# Patient Record
Sex: Female | Born: 1970 | Race: Black or African American | Hispanic: No | Marital: Married | Smoking: Never smoker
Health system: Southern US, Community
[De-identification: ages and names within clinical notes are randomized; demographics above are authoritative.]

## PROBLEM LIST (undated history)

## (undated) DIAGNOSIS — J45909 Unspecified asthma, uncomplicated: Secondary | ICD-10-CM

## (undated) DIAGNOSIS — Z8709 Personal history of other diseases of the respiratory system: Secondary | ICD-10-CM

## (undated) DIAGNOSIS — N3281 Overactive bladder: Secondary | ICD-10-CM

## (undated) HISTORY — DX: Unspecified asthma, uncomplicated: J45.909

## (undated) HISTORY — PX: BREAST EXCISIONAL BIOPSY: SUR124

## (undated) HISTORY — DX: Personal history of other diseases of the respiratory system: Z87.09

## (undated) HISTORY — DX: Overactive bladder: N32.81

---

## 1999-07-25 ENCOUNTER — Encounter: Admission: RE | Admit: 1999-07-25 | Discharge: 1999-10-23 | Payer: Self-pay | Admitting: Obstetrics & Gynecology

## 1999-07-29 ENCOUNTER — Inpatient Hospital Stay (HOSPITAL_COMMUNITY): Admission: AD | Admit: 1999-07-29 | Discharge: 1999-07-29 | Payer: Self-pay | Admitting: Obstetrics and Gynecology

## 1999-08-01 ENCOUNTER — Inpatient Hospital Stay (HOSPITAL_COMMUNITY): Admission: AD | Admit: 1999-08-01 | Discharge: 1999-08-01 | Payer: Self-pay | Admitting: *Deleted

## 1999-08-05 ENCOUNTER — Encounter (HOSPITAL_COMMUNITY): Admission: RE | Admit: 1999-08-05 | Discharge: 1999-09-10 | Payer: Self-pay | Admitting: Obstetrics & Gynecology

## 1999-09-09 ENCOUNTER — Inpatient Hospital Stay (HOSPITAL_COMMUNITY): Admission: AD | Admit: 1999-09-09 | Discharge: 1999-09-11 | Payer: Self-pay | Admitting: Obstetrics and Gynecology

## 1999-09-14 ENCOUNTER — Inpatient Hospital Stay (HOSPITAL_COMMUNITY): Admission: AD | Admit: 1999-09-14 | Discharge: 1999-09-14 | Payer: Self-pay | Admitting: Obstetrics and Gynecology

## 2000-10-25 ENCOUNTER — Ambulatory Visit (HOSPITAL_COMMUNITY): Admission: RE | Admit: 2000-10-25 | Discharge: 2000-10-25 | Payer: Self-pay | Admitting: Internal Medicine

## 2001-02-17 ENCOUNTER — Other Ambulatory Visit: Admission: RE | Admit: 2001-02-17 | Discharge: 2001-02-17 | Payer: Self-pay | Admitting: Obstetrics and Gynecology

## 2001-08-08 ENCOUNTER — Other Ambulatory Visit: Admission: RE | Admit: 2001-08-08 | Discharge: 2001-08-08 | Payer: Self-pay | Admitting: Obstetrics and Gynecology

## 2001-12-07 ENCOUNTER — Other Ambulatory Visit: Admission: RE | Admit: 2001-12-07 | Discharge: 2001-12-07 | Payer: Self-pay | Admitting: Obstetrics and Gynecology

## 2002-04-28 ENCOUNTER — Other Ambulatory Visit: Admission: RE | Admit: 2002-04-28 | Discharge: 2002-04-28 | Payer: Self-pay | Admitting: Obstetrics and Gynecology

## 2002-11-03 ENCOUNTER — Inpatient Hospital Stay (HOSPITAL_COMMUNITY): Admission: AD | Admit: 2002-11-03 | Discharge: 2002-11-05 | Payer: Self-pay | Admitting: Obstetrics and Gynecology

## 2002-12-21 ENCOUNTER — Other Ambulatory Visit: Admission: RE | Admit: 2002-12-21 | Discharge: 2002-12-21 | Payer: Self-pay | Admitting: Obstetrics and Gynecology

## 2003-02-02 ENCOUNTER — Encounter (INDEPENDENT_AMBULATORY_CARE_PROVIDER_SITE_OTHER): Payer: Self-pay | Admitting: *Deleted

## 2003-02-02 ENCOUNTER — Ambulatory Visit (HOSPITAL_COMMUNITY): Admission: RE | Admit: 2003-02-02 | Discharge: 2003-02-02 | Payer: Self-pay

## 2003-07-03 ENCOUNTER — Other Ambulatory Visit: Admission: RE | Admit: 2003-07-03 | Discharge: 2003-07-03 | Payer: Self-pay | Admitting: Obstetrics and Gynecology

## 2004-12-22 ENCOUNTER — Encounter: Admission: RE | Admit: 2004-12-22 | Discharge: 2004-12-22 | Payer: Self-pay | Admitting: Internal Medicine

## 2004-12-28 ENCOUNTER — Encounter: Admission: RE | Admit: 2004-12-28 | Discharge: 2004-12-28 | Payer: Self-pay | Admitting: Internal Medicine

## 2005-02-04 ENCOUNTER — Encounter: Admission: RE | Admit: 2005-02-04 | Discharge: 2005-02-04 | Payer: Self-pay | Admitting: Obstetrics and Gynecology

## 2005-02-05 ENCOUNTER — Ambulatory Visit: Payer: Self-pay | Admitting: Critical Care Medicine

## 2005-05-27 ENCOUNTER — Ambulatory Visit: Payer: Self-pay | Admitting: Pulmonary Disease

## 2005-09-11 ENCOUNTER — Ambulatory Visit: Payer: Self-pay | Admitting: Pulmonary Disease

## 2006-04-29 ENCOUNTER — Encounter: Admission: RE | Admit: 2006-04-29 | Discharge: 2006-04-29 | Payer: Self-pay | Admitting: Obstetrics and Gynecology

## 2006-10-14 ENCOUNTER — Ambulatory Visit: Payer: Self-pay | Admitting: Cardiovascular Disease

## 2006-10-14 ENCOUNTER — Ambulatory Visit (HOSPITAL_COMMUNITY): Admission: RE | Admit: 2006-10-14 | Discharge: 2006-10-14 | Payer: Self-pay | Admitting: Cardiology

## 2006-10-19 ENCOUNTER — Ambulatory Visit: Payer: Self-pay | Admitting: Critical Care Medicine

## 2007-06-14 ENCOUNTER — Encounter: Admission: RE | Admit: 2007-06-14 | Discharge: 2007-06-14 | Payer: Self-pay | Admitting: Obstetrics and Gynecology

## 2007-10-06 DIAGNOSIS — J209 Acute bronchitis, unspecified: Secondary | ICD-10-CM | POA: Insufficient documentation

## 2007-10-06 DIAGNOSIS — J309 Allergic rhinitis, unspecified: Secondary | ICD-10-CM | POA: Insufficient documentation

## 2007-10-06 DIAGNOSIS — J069 Acute upper respiratory infection, unspecified: Secondary | ICD-10-CM | POA: Insufficient documentation

## 2007-10-06 DIAGNOSIS — J4520 Mild intermittent asthma, uncomplicated: Secondary | ICD-10-CM | POA: Insufficient documentation

## 2007-10-07 ENCOUNTER — Ambulatory Visit: Payer: Self-pay | Admitting: Critical Care Medicine

## 2008-10-23 ENCOUNTER — Ambulatory Visit: Payer: Self-pay | Admitting: Critical Care Medicine

## 2008-10-26 ENCOUNTER — Encounter: Payer: Self-pay | Admitting: Critical Care Medicine

## 2009-04-12 ENCOUNTER — Encounter: Payer: Self-pay | Admitting: Critical Care Medicine

## 2009-04-22 ENCOUNTER — Encounter: Payer: Self-pay | Admitting: Critical Care Medicine

## 2009-06-05 ENCOUNTER — Encounter: Admission: RE | Admit: 2009-06-05 | Discharge: 2009-06-05 | Payer: Self-pay | Admitting: Obstetrics and Gynecology

## 2009-11-18 ENCOUNTER — Ambulatory Visit: Payer: Self-pay | Admitting: Critical Care Medicine

## 2010-02-03 ENCOUNTER — Encounter
Admission: RE | Admit: 2010-02-03 | Discharge: 2010-04-10 | Payer: Self-pay | Source: Home / Self Care | Admitting: Internal Medicine

## 2010-02-20 ENCOUNTER — Encounter: Payer: Self-pay | Admitting: Critical Care Medicine

## 2010-08-02 ENCOUNTER — Other Ambulatory Visit: Payer: Self-pay | Admitting: Obstetrics and Gynecology

## 2010-08-02 DIAGNOSIS — Z0000 Encounter for general adult medical examination without abnormal findings: Secondary | ICD-10-CM

## 2010-08-13 ENCOUNTER — Ambulatory Visit
Admission: RE | Admit: 2010-08-13 | Discharge: 2010-08-13 | Disposition: A | Discharge status: Home / Self Care | Payer: BC Managed Care – HMO | Source: Ambulatory Visit | Attending: Obstetrics and Gynecology | Admitting: Obstetrics and Gynecology

## 2010-08-13 DIAGNOSIS — Z0000 Encounter for general adult medical examination without abnormal findings: Secondary | ICD-10-CM

## 2010-08-14 NOTE — Assessment & Plan Note (Signed)
Summary: Pulmonary OV   Primary Provider/Referring Provider:  Dr. Velna Hatchet  CC:  Yearly Follow up.  Pt states breathing doing well overall.  Denies wheezing and chest tightness.  Pt does c/o a congested cough that is occ prod with white mucus x 2 wks.  .  History of Present Illness: This is a 40 year old African female with mild intermittent asthma.   Nov 18, 2009 4:22 PM Since last ov 12months ago The cough developed after a uri two weeks ago, raspy, cough is dry worse in AM and overnite, Now not much dyspnea with exertion, no chest pain.  Notes some pn drip, does not like astepro. Pt denies any significant sore throat, nasal congestion or excess secretions, fever, chills, sweats, unintended weight loss, pleurtic or exertional chest pain, orthopnea PND, or leg swelling Pt denies any increase in rescue therapy over baseline, denies waking up needing it or having any early am or nocturnal exacerbations of coughing/wheezing/or dyspnea. Pt also denies any obvious fluctuation in symptoms wiht weather or environmental change or other alleviating or aggravating factors   Asthma History    Initial Asthma Severity Rating:    Age range: 12+ years    Symptoms: >2 days/week; not daily    Nighttime Awakenings: 3-4/month    Interferes w/ normal activity: minor limitations    SABA use (not for EIB): 0-2 days/week    Exacerbations requiring oral systemic steroids: 0-1/year    Asthma Severity Assessment: Mild Persistent   Preventive Screening-Counseling & Management  Alcohol-Tobacco     Smoking Status: never  Current Medications (verified): 1)  Multivitamins   Tabs (Multiple Vitamin) .... Take 1 Tablet By Mouth Once A Day 2)  Zyrtec Allergy 10 Mg  Tabs (Cetirizine Hcl) .Marland Kitchen.. 1 By Mouth Daily 3)  Caltrate 600+Carroll Plus 600-400 Mg-Unit Tabs (Calcium Carbonate-Vit Carroll-Min) .Marland Kitchen.. 1 By Mouth Two Times A Day 4)  Singulair 10 Mg Tabs (Montelukast Sodium) .... Once Daily 5)  Astepro 0.15 % Soln  (Azelastine Hcl) .... As Needed 6)  Relpax 40 Mg Tabs (Eletriptan Hydrobromide) .... As Needed 7)  Ventolin Hfa 108 (90 Base) Mcg/act  Aers (Albuterol Sulfate) .Marland Kitchen.. 1-2 Puffs Every 4-6 Hours As Needed  Allergies (verified): 1)  ! Penicillin 2)  ! Cephalosporins  Past History:  Past medical, surgical, family and social histories (including risk factors) reviewed, and no changes noted (except as noted below).  Past Medical History: Current Problems:  Hx of ASTHMATIC BRONCHITIS, ACUTE (ICD-466.0) ASTHMA (ICD-493.90) Hx of UPPER RESPIRATORY INFECTION (ICD-465.9) ALLERGIC RHINITIS (ICD-477.9)  Overactive bladder  Family History: Reviewed history from 10/07/2007 and no changes required. noncontrib  Social History: Reviewed history from 10/07/2007 and no changes required. 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  The patient denies shortness of breath with activity, shortness of breath at rest, productive cough, coughing up blood, chest pain, irregular heartbeats, acid heartburn, indigestion, loss of appetite, weight change, abdominal pain, difficulty swallowing, sore throat, tooth/dental problems, headaches, sneezing, itching, ear ache, anxiety, depression, hand/feet swelling, joint stiffness or pain, rash, change in color of mucus, and fever.    Vital Signs:  Patient profile:   40 year old female Height:      60 inches Weight:      200 pounds BMI:     39.20 O2 Sat:      98 % on Room  air Temp:     97.5 degrees F oral Pulse rate:   93 / minute BP sitting:   138 / 78  (left arm) Cuff size:   large  Vitals Entered By: Gweneth Dimitri RN (Nov 18, 2009 4:11 PM)  O2 Flow:  Room air CC: Yearly Follow up.  Pt states breathing doing well overall.  Denies wheezing and chest tightness.  Pt does c/o a congested cough that is occ prod with white mucus x 2 wks.    Comments Medications reviewed with patient Daytime contact number verified with patient. Gweneth Dimitri RN  Nov 18, 2009 4:12 PM    Physical Exam  Additional Exam:  Gen: Pleasant, well nourished, in no distress ENT: nasal erythema, mild postnasal drip Neck: No JVD, no TMG, no carotid bruits Lungs: no use of accessory muscles, no dullness to percussion, exp wheezes Cardiovascular: RRR, heart sounds normal, no murmurs or gallops, no peripheral edema Musculoskeletal: No deformities, no cyanosis or clubbing     Pulmonary Function Test Date: 11/18/2009 4:35 PM Gender: Female  Pre-Spirometry FVC    Value: 1.69 L/min   % Pred: 62.10 % FEV1    Value: 1.35 L     Pred: 2.25 L     % Pred: 60 % FEV1/FVC  Value: 80.04 %     % Pred: 95.90 %  Impression & Recommendations:  Problem # 1:  ASTHMA (ICD-493.90) Assessment Deteriorated  Stable asthma with flare due to mild allergic rhinitis and atopic features, spirometry with peripheral airflow obstruciton plan: cont asterpo/zyrtec cont singulair pulse prednisone start flovent discus 100 one puff two times a day  Medications Added to Medication List This Visit: 1)  Singulair 10 Mg Tabs (Montelukast sodium) .... Once daily 2)  Astepro 0.15 % Soln (Azelastine hcl) .... As needed 3)  Relpax 40 Mg Tabs (Eletriptan hydrobromide) .... As needed 4)  Ventolin Hfa 108 (90 Base) Mcg/act Aers (Albuterol sulfate) .Marland Kitchen.. 1-2 puffs every 4-6 hours as needed 5)  Prednisone 10 Mg Tabs (Prednisone) .... Take as directed take 4 daily for two days, then 3 daily for two days, then two daily for two days then one daily for two days then stop 6)  Flovent Diskus 100 Mcg/blist Aepb (Fluticasone propionate (inhal)) .... One puff two times a day  Complete Medication List: 1)  Multivitamins Tabs (Multiple vitamin) .... Take 1 tablet by mouth once a day 2)  Zyrtec Allergy 10 Mg Tabs (Cetirizine hcl) .Marland Kitchen.. 1 by mouth daily 3)  Caltrate 600+Carroll Plus 600-400 Mg-unit Tabs  (Calcium carbonate-vit Carroll-min) .Marland Kitchen.. 1 by mouth two times a day 4)  Singulair 10 Mg Tabs (Montelukast sodium) .... Once daily 5)  Astepro 0.15 % Soln (Azelastine hcl) .... As needed 6)  Relpax 40 Mg Tabs (Eletriptan hydrobromide) .... As needed 7)  Ventolin Hfa 108 (90 Base) Mcg/act Aers (Albuterol sulfate) .Marland Kitchen.. 1-2 puffs every 4-6 hours as needed 8)  Prednisone 10 Mg Tabs (Prednisone) .... Take as directed take 4 daily for two days, then 3 daily for two days, then two daily for two days then one daily for two days then stop 9)  Flovent Diskus 100 Mcg/blist Aepb (Fluticasone propionate (inhal)) .... One puff two times a day  Other Orders: Est. Patient Level IV (95621) Spirometry w/Graph (30865) Prescription Created Electronically [phone])  Patient Instructions: 1)  Start Flovent Discus  one puff two times a day 2)  Prednisone 10mg  Take 4 daily for two days, then 3 daily for two days,  then two daily for two days then one daily for two days then stop 3)  Stay on singulair, astepro, zyrtec. 4)  Return 3 months for recheck Prescriptions: FLOVENT DISKUS 100 MCG/BLIST AEPB (FLUTICASONE PROPIONATE (INHAL)) one puff two times a day  #1 x 6   Entered and Authorized by:   Storm Frisk MD   Signed by:   Storm Frisk MD on 11/18/2009   Method used:   Electronically to        [facility] (retail)       [address]       [place], [place]  [address]       Ph: [phone]       Fax: [phone]   RxID:   [id number] PREDNISONE 10 MG  TABS (PREDNISONE) Take as directed Take 4 daily for two days, then 3 daily for two days, then two daily for two days then one daily for two days then stop  #20 x 0   Entered and Authorized by:   Storm Frisk MD   Signed by:   Storm Frisk MD on 11/18/2009   Method used:   Electronically to        [facility] (retail)       [address]       [place], [place]  [address]       Ph:  [phone]       Fax: [phone]   RxID:   [id number]    CardioPerfect Spirometry  ID: [id number] Patient: Lauren Carroll DOB: 04-23-1971 Age: 40 Years Old Sex: Female Race: Black Height: 60 Weight: 200 Status: Confirmed Past Medical History:  Current Problems:  Hx of ASTHMATIC BRONCHITIS, ACUTE (ICD-466.0) ASTHMA (ICD-493.90) Hx of UPPER RESPIRATORY INFECTION (ICD-465.9) ALLERGIC RHINITIS (ICD-477.9)   Recorded: 11/18/2009 4:35 PM  Parameter  Measured Predicted %Predicted FVC     1.69        2.71        62.10 FEV1     1.35        2.25        60 FEV1%   80.04        83.50        95.90 PEF    2.13        6.07        35.10   Comments: Combined restriction and obstruction,    Interpretation: Pre: FVC= 1.69L FEV1= 1.35L FEV1%= 80.0% 1.35/1.69 FEV1/FVC (11/18/2009 4:38:39 PM), Moderate restriction     Appended Document: Pulmonary OV fax Dorothyann Peng

## 2010-08-14 NOTE — Medication Information (Signed)
Summary: Flovent / [facility]  Flovent / [facility]   Imported By: Lennie Odor 02/25/2010 14:34:05  _____________________________________________________________________  External Attachment:    Type:   Image     Comment:   External Document

## 2010-08-18 ENCOUNTER — Other Ambulatory Visit: Payer: Self-pay | Admitting: Obstetrics and Gynecology

## 2010-08-18 DIAGNOSIS — R928 Other abnormal and inconclusive findings on diagnostic imaging of breast: Secondary | ICD-10-CM

## 2010-08-20 ENCOUNTER — Telehealth: Payer: Self-pay | Admitting: Critical Care Medicine

## 2010-08-22 ENCOUNTER — Ambulatory Visit
Admission: RE | Admit: 2010-08-22 | Discharge: 2010-08-22 | Disposition: A | Discharge status: Home / Self Care | Payer: BC Managed Care – HMO | Source: Ambulatory Visit | Attending: Obstetrics and Gynecology | Admitting: Obstetrics and Gynecology

## 2010-08-22 DIAGNOSIS — R928 Other abnormal and inconclusive findings on diagnostic imaging of breast: Secondary | ICD-10-CM

## 2010-08-28 NOTE — Progress Notes (Signed)
Summary: prescription for singular-LMTCB x 1  Phone Note Call from Patient   Caller: Patient Call For: Lauren Carroll Summary of Call: Patient phoned stated that she normally gets home delivery for her Singular but she is out and needs a prescription called into [facility] on [address] to last until she gets her home delivery. She stated that we will also be getting a request from [facility] for her home delivery. Patient can be reached at [phone] Initial call taken by: [name],  August 20, 2010 11:07 AM  Follow-up for Phone Call        Due for appt- LMOMTCB  [name]  August 20, 2010 11:09 AM  Pt has scheduled follow up with [facility]. States [facility] will fax request for home delivery. 30 day supply of Singulair sent to [facility]. Zackery Barefoot CMA  August 20, 2010 11:24 AM      Prescriptions: SINGULAIR 10 MG TABS (MONTELUKAST SODIUM) once daily  #30 x 0   Entered by:   Zackery Barefoot CMA   Authorized by:   Storm Frisk MD   Signed by:   Zackery Barefoot CMA on 08/20/2010   Method used:   Electronically to        [facility] (retail)       [address]       [place], [place]  [address]       Ph: [phone]       Fax: [phone]   RxID:   [id number]

## 2010-10-07 ENCOUNTER — Encounter: Payer: Self-pay | Admitting: Critical Care Medicine

## 2010-10-08 ENCOUNTER — Encounter: Payer: Self-pay | Admitting: Critical Care Medicine

## 2010-10-08 ENCOUNTER — Ambulatory Visit (INDEPENDENT_AMBULATORY_CARE_PROVIDER_SITE_OTHER): Payer: BC Managed Care – HMO | Admitting: Critical Care Medicine

## 2010-10-08 VITALS — BP 132/90 | HR 74 | Temp 97.2°F | Ht 61.0 in | Wt 198.8 lb

## 2010-10-08 DIAGNOSIS — J45909 Unspecified asthma, uncomplicated: Secondary | ICD-10-CM

## 2010-10-08 MED ORDER — ALBUTEROL SULFATE HFA 108 (90 BASE) MCG/ACT IN AERS: 2.0000 | INHALATION_SPRAY | RESPIRATORY_TRACT | Status: DC | PRN

## 2010-10-08 NOTE — Patient Instructions (Signed)
No change in medication Focus on weight loss, avoid apple sauce Return 12 months, sooner if needed

## 2010-10-08 NOTE — Assessment & Plan Note (Signed)
Stable asthma,  Weight is an issue.   Plan Diet counselling   Eating too much apple sauce No change in medications

## 2010-10-08 NOTE — Progress Notes (Signed)
Subjective:    Patient ID: Lauren Carroll, female    DOB: 07-16-1970, 40 y.o.   MRN: [id number]  Shortness of Breath This is a chronic problem. The current episode started more than 1 year ago. Episode frequency: exertional only, up 4  flights of stairs, on level ground is dyspneic 1 block,  The problem has been unchanged. Pertinent negatives include no chest pain, fever, headaches, hemoptysis, orthopnea, PND, rhinorrhea, sore throat, sputum production, vomiting or wheezing. The symptoms are aggravated by exercise. Associated symptoms comments: No pn drip, no nasal issues . She has tried steroid inhalers for the symptoms. The treatment provided moderate relief. Her past medical history is significant for allergies and asthma.   This is a 40 y.o.   African female with mild intermittent asthma.   10/08/2010 Since last OV 5/11:  Not much change, now weight issues,  Has to walk long distance to her office and is dyspneic.  Also an issue walking up 4 flights of stairs.  See above for symptoms  Review of Systems  Constitutional: Negative for fever.  HENT: Negative for sore throat and rhinorrhea.   Respiratory: Positive for shortness of breath. Negative for hemoptysis, sputum production and wheezing.   Cardiovascular: Negative for chest pain, orthopnea and PND.  Gastrointestinal: Negative for vomiting.  Neurological: Negative for headaches.   Past Medical History  Diagnosis Date  . Asthmatic bronchitis   . Asthma   . History of URI (upper respiratory infection)   . Allergic rhinitis   . Overactive bladder      History reviewed. No pertinent family history.   History   Social History  . Marital Status: Married    Spouse Name: N/A    Number of Children: N/A  . Years of Education: N/A   Occupational History  . Nurse Instructor RN    Social History Main Topics  . Smoking status: Never Smoker   . Smokeless tobacco: Never Used  . Alcohol Use: No  . Drug Use: No  . Sexually Active:  Not on file   Other Topics Concern  . Not on file   Social History Narrative  . No narrative on file     Allergies  Allergen Reactions  . Cephalosporins   . Penicillins   . Sulfa Drugs Cross Reactors     itching     Outpatient Prescriptions Prior to Visit  Medication Sig Dispense Refill  . Azelastine HCl (ASTEPRO) 0.15 % SOLN As needed       . Calcium Carbonate-Vitamin D (CALTRATE 600+D) 600-400 MG-UNIT per tablet Take 1 tablet by mouth 2 (two) times daily.        . cetirizine (ZYRTEC) 10 MG tablet Take 10 mg by mouth daily.        Marland Kitchen eletriptan (RELPAX) 40 MG tablet One tablet by mouth as needed for migraine headache.  If the headache improves and then returns, dose may be repeated after 2 hours have elapsed since first dose (do not exceed 80 mg per day). may repeat in 2 hours if necessary       . Fluticasone Propionate, Inhal, (FLOVENT DISKUS) 100 MCG/BLIST AEPB One puff two times a day       . montelukast (SINGULAIR) 10 MG tablet Take 10 mg by mouth daily.        . Multiple Vitamin (MULTIVITAMINS PO) Take 1 tablet by mouth once a day       . albuterol (VENTOLIN HFA) 108 (90 BASE) MCG/ACT inhaler 1-2 puffs  every 4-6 hours as needed              Objective:   Physical Exam Gen: Pleasant, obese , in no distress,  normal affect  ENT: No lesions,  mouth clear,  oropharynx clear, no postnasal drip  Neck: No JVD, no TMG, no carotid bruits  Lungs: No use of accessory muscles, no dullness to percussion, clear without rales or rhonchi  Cardiovascular: RRR, heart sounds normal, no murmur or gallops, no peripheral edema  Abdomen: soft and NT, no HSM,  BS normal  Musculoskeletal: No deformities, no cyanosis or clubbing  Neuro: alert, non focal  Skin: Warm, no lesions or rashes         Assessment & Plan:   ASTHMA Stable asthma,  Weight is an issue.   Plan Diet counselling   Eating too much apple sauce No change in medications    Updated Medication  List Outpatient Encounter Prescriptions as of 10/08/2010  Medication Sig Dispense Refill  . albuterol (VENTOLIN HFA) 108 (90 BASE) MCG/ACT inhaler Inhale 2 puffs into the lungs every 4 (four) hours as needed for wheezing.  1 Inhaler  11  . Azelastine HCl (ASTEPRO) 0.15 % SOLN As needed       . Calcium Carbonate-Vitamin D (CALTRATE 600+D) 600-400 MG-UNIT per tablet Take 1 tablet by mouth 2 (two) times daily.        . cetirizine (ZYRTEC) 10 MG tablet Take 10 mg by mouth daily.        . Cholecalciferol (VITAMIN D) 2000 UNITS CAPS Take by mouth daily.        Marland Kitchen eletriptan (RELPAX) 40 MG tablet One tablet by mouth as needed for migraine headache.  If the headache improves and then returns, dose may be repeated after 2 hours have elapsed since first dose (do not exceed 80 mg per day). may repeat in 2 hours if necessary       . Fluticasone Propionate, Inhal, (FLOVENT DISKUS) 100 MCG/BLIST AEPB One puff two times a day       . montelukast (SINGULAIR) 10 MG tablet Take 10 mg by mouth daily.        . Multiple Vitamin (MULTIVITAMINS PO) Take 1 tablet by mouth once a day       . DISCONTD: albuterol (VENTOLIN HFA) 108 (90 BASE) MCG/ACT inhaler 1-2 puffs every 4-6 hours as needed

## 2010-11-28 NOTE — Op Note (Signed)
Lauren Carroll, Lauren Carroll                          ACCOUNT NO.:  [id number]   MEDICAL RECORD NO.:  [id number]                   PATIENT TYPE:  AMB   LOCATION:  SDC                                  FACILITY:  [facility]   PHYSICIAN:  Maxie Better, M.D.            DATE OF BIRTH:  September 17, 1970   DATE OF PROCEDURE:  02/02/2003  DATE OF DISCHARGE:  02/02/2003                                 OPERATIVE REPORT   PREOPERATIVE DIAGNOSIS:  Desires sterilization.   POSTOPERATIVE DIAGNOSES:  1. Desires sterilization.  2. Left paratubal cyst.   PROCEDURE:  1. Laparoscopic tubal ligation with bipolar cautery.  2. Left paratubal cystectomy.   SURGEON:  Maxie Better, M.D.   ANESTHESIA:  General.   INDICATION:  This is a 40 year old gravida 2, para 2 female with a prior  history of an umbilical hernia repair, last menstrual period of February 01, 2003, who now presents for permanent sterilization.  Risks and benefits of  procedure have been explained to the patient.  Alternative birth control  methods have been declined.   PROCEDURE:  Under adequate general anesthesia, the patient was placed in the  dorsal lithotomy position.  Examination under anesthesia revealed a normal-  sized anteverted uterus.  No adnexal masses could be appreciated.  The  patient was thoroughly prepped and draped in usual fashion.  The bladder was  catheterized for a small amount of urine.  A bivalved speculum was placed in  the vagina.  A single-tooth tenaculum was placed on the anterior lip of the  cervix and acorn cannula was introduced into the cervical os and attached to  the tenaculum for manipulation of the uterus.  The bivalved speculum was  removed.  Attention was then turned to the abdomen, where an infraumbilical  scar was seen.  Decision was made to proceed supraumbilically.  Marcaine  0.25% was injected supraumbilically.  A supraumbilical transverse incision  was then made.  The Veress needle was introduced.   Saline was used to test  with good placement noted.  Pressure of 4 was noted.  Three liters of carbon  dioxide were insufflated.  The Veress needle was removed.  A disposable  trocar was introduced into the abdomen without incident.  A lighted video  laparoscope was placed through the port.  Inspection of the pelvis was done.  Normal liver edge was noted, no evidence of adhesions seen.  A suprapubic  incision was then made and a 5-mm port was introduced into the abdomen  without incident.  A probe was then utilized to inspect the pelvis; no  uterus was noted, no evidence of endometriosis was noted, normal right tube  and ovary were noted, left ovary was noted.  The left tube at the distal end  had about a 3-cm cystic mass.  A second site was placed in the left lower  quadrant.  A 5-mm port was placed after a small incision  was made.  The  bipolar cautery was then utilized to cauterize the midportion of the right  fallopian tube for about 1.5 cm.  Through the suprapubic port, the left tube  was stabilized, and particularly the cyst.  Through the left lower quadrant  site, hot scissors were then placed and the serosal surface of the cyst was  then cauterized, at which time incidental rupture of the cyst was noted with  clear yellowish fluid.  Using the alligator scissors, the cyst was dissected  from its surrounding tissue and using traction and countertraction with an  atraumatic grasper, the cyst was then further removed.  The base was then  cut with the hot scissors; good hemostasis was noted.  The abdomen was then  irrigated and suctioned, good hemostasis again noted.  The midportion of the  left fallopian tube was then cauterized using the bipolar cautery for about  a centimeter and a half.  Re-inspection of all sites showed no evidence of  bleeding.  The lower portion was then removed under direct visualization.  The appendix was then seen, which was normal.  The abdomen was deflated.   The supraumbilical port was then removed.  The rectus fascia was then  grasped with Allis clamps supraumbilically and the fascia was then closed  with figure-of-eight 0 Vicryl suture.  The supraumbilical site was then  closed with a subcuticular stitch of 4-0 Vicryl suture and the remaining  lower ports were then closed with Dermabond.  The instruments from the  vagina were then removed.  Specimen was the left cyst wall.  Estimated blood  loss was minimal.  Intraoperative fluid was about 1200.  Urine output was  about 60 mL total.  Complication was none.  The patient tolerated the  procedure well and was transferred to the recovery room in stable condition.                                                Maxie Better, M.D.    [place]/MEDQ  D:  02/02/2003  T:  02/04/2003  Job:  [id number]

## 2010-11-28 NOTE — H&P (Signed)
[facility]  Patient:    Lauren Carroll, Lauren Carroll                       MRN: [id number] Adm. Date:  [id number] Attending:  Shaune Spittle Dictator:   Wynelle Bourgeois, CNM                         History and Physical  BRIEF HISTORY:  Ms. 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  Her pregnancy history is remarkable for uncertain LMP, history of asthma, and mixed connective tissue disorder.  PRENATAL LABORATORY DATA:  Hemoglobin 11.9, platelets 232, blood type B positive, antibody screen negative, sickle cell trait negative, RPR nonreactive, rubella immune, HBSAG negative, ______ elevated at 148, 3 hour GTT with the values of 82, 183, 209 and 127 (elevated at 2 hours).  AFP was declined.  ALLERGIES:  CEPHALOSPORINS and PENICILLIN (rash).  CURRENT MEDICATIONS: 1. Prenatal vitamins. 2. Serevent p.r.n. 3. Flovent p.r.n. 4. Baby aspirin 1 q. day.  PAST MEDICAL HISTORY:  Remarkable for umbilical hernia repair as a child with ______  extraction, asthma, cyst aspirated in July of 2000, and usual childhood diseases.  FAMILY HISTORY:  Her maternal aunt had pregnancy induced hypertension.  Her cousin had a facial deformity.  Maternal grandfather and father had heart disease. Hypertension in patients mother, father, brother, maternal grandparents and paternal grandparents.  Insulin-dependent diabetes mellitus in patients father nd maternal grandparents.  Genetic history significant for patients aunt with Downs syndrome.  SOCIAL HISTORY:  [place] is married to [place] who works in the Education administrator.  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  PHYSICAL EXAMINATION:  HEENT:  Within normal limits.  LUNGS:   Clear to auscultation bilaterally.  HEART:  Heart rate regular rate and rhythm, no murmur.  ABDOMEN:  Gravid at approximately 40 cm.  Uterine contractions occasional.  CERVICAL EXAM:  4 cm, 80% effaced, minus 1 station, fetal heart rate reassuring.  EXTREMITIES:  Within normal limits.  ASSESSMENT: 1. Intrauterine pregnancy at 40 weeks and 4 days. 2. Mixed connective tissue disorder.  PLAN: 1. Admit to Cecilio Asper, M.D. service. 2. Artificial rupture of membranes effected by Dr. Kathryne Sharper this morning for    clear fluid. 3. Intrauterine pressure catheter inserted. 4. Pitocin augmentation p.r.n. DD:  09/09/99 TD:  09/09/99 Job: [id number]

## 2010-11-28 NOTE — H&P (Signed)
Lauren Carroll, Carroll                          ACCOUNT NO.:  [id number]   MEDICAL RECORD NO.:  [id number]                   PATIENT TYPE:  INP   LOCATION:  9162                                 FACILITY:  [facility]   PHYSICIAN:  Maxie Better, M.D.            DATE OF BIRTH:  [birth year]   DATE OF ADMISSION:  11/03/2002  DATE OF DISCHARGE:                                HISTORY & PHYSICAL   CHIEF COMPLAINT:  Induction of labor.   HISTORY OF PRESENT ILLNESS:  This is a 40 year old gravida 2, para 1-0-0-1  married black female.  LMP is February 10, 2002.  EDC of Nov 18, 2002 consistent  with first trimester ultrasound at 13.9 weeks who is now at [redacted] weeks  gestation being admitted for induction of labor secondary to mixed  connective tissue disorder.  The patient is noted to be positive lupus  anticoagulant.  She has been on baby aspirin up until two weeks ago.  Her  prenatal course has been remarkable for asthmatic flare which has been  subsequently managed with a pulmonologist.  The patient has had irregular  contractions, good fetal movements.  On her last examination in the office  on October 26, 2002 she had a cervix that was 3 cm dilated, 70% effaced, -1,  vertex presentation.  The patient is group B Strep negative on October 11, 2002.  She desires permanent sterilization.  Prenatal care was at [facility].  Primary obstetrician Maxie Better, M.D.   PRENATAL LABORATORIES:  Blood type is B+.  Antibody screen negative.  RPR  nonreactive.  Rubella is immune.  Hepatitis B surface antigen is negative.  HIV test was nonreactive.  GC and Chlamydia cultures are negative.  Pap in  October of 2003 showed ASCUS.  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  Repeat Pap  planned postpartum.  Negative GC and Chlamydia cultures.  Normal anatomic  fetal survey on December 22 at 20.2 weeks.  The patient declined AFP 3  testing.   Abnormal one hour glucose challenge test.  Normal three hour  glucose tolerance test.  Ultrasound for SGA baby on October 11, 2002 showed an  estimated fetal weight of 5 pounds which was at the 20th percentile.  Amniotic fluid index of 8.9.  Group B Strep culture is negative.   ALLERGIES:  PENICILLIN, CEPHALOSPORIN.   MEDICATIONS:  1. Prenatal vitamins.  2. Pulmicort.  3. Foradil inhalers.   PAST MEDICAL HISTORY:  1. Asthma.  2. Migraines.  3. 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   OBSTETRICAL HISTORY:  February of 2001 forceps delivery live female 6 pounds  8 ounces 37 weeks.  Postpartum hemorrhage.  No transfusions.   FAMILY HISTORY:  Noncontributory.   SOCIAL HISTORY:  Married.  Nonsmoker.  One child.  Works as a Runner, broadcasting/film/video.   REVIEW OF SYSTEMS:  Negative except as noted in history of present illness.   PHYSICAL EXAMINATION:  GENERAL:  Well-developed, well-nourished, gravid  black female in no acute distress.  VITAL SIGNS:  Blood pressure 123/59, afebrile.  SKIN:  No lesions.  HEENT:  Anicteric sclerae.  Pink conjunctivae.  Oropharynx negative.  HEART:  Regular rate and rhythm without murmur.  LUNGS:  Clear to auscultation.  BREASTS:  Soft.  Nontender.  No palpable mass.  NECK:  Supple.  No palpable axillary, supraclavicular nodes.  ABDOMEN:  Gravid.  Term fundal height.  PELVIC:  3, 80%, -1, vertex.  EXTREMITIES:  Trace edema.   LABORATORIES:  Tracing:  Fetal heart rate baseline of 160, irregular  contractions.   IMPRESSION:  1. Mixed connective tissue disorder for induction.  2. Asthmatic.  3. Intrauterine gestation at 38 weeks.  4. History of postpartum hemorrhage.  5. Desires permanent sterilization.   PLAN:  Admission.  Routine admission laboratories.  Artificial rupture of  membranes.  Postpartum tubal ligation.  Low dose Pitocin  Risks of procedure  with respect to tubal ligation have been  reviewed.  Alternative birth  control methods were declined.  The patient understands it is unreversible,  failure rate of 1 out of 300.  Post tubal ligation syndrome was discussed.  Injury to surrounding organs structures was also discussed.                                               Maxie Better, M.D.    /MEDQ  D:  11/03/2002  T:  11/03/2002  Job:  [id number]

## 2010-11-28 NOTE — Assessment & Plan Note (Signed)
[facility]                             PULMONARY OFFICE NOTE   Lauren Carroll, Lauren Carroll                     MRN:          [id number]  DATE:10/21/2006                            DOB:          [birth year]    Lauren Carroll returns in followup, has not been seen in this office since  March 2007, and I have not seen the patient since October 2005.  This is  a 40 year old African-American female, history of moderate persistent  asthma.  She had been controlled on Singulair 10 mg daily and albuterol  p.r.n.  She did not require systemic steroids or inhaled  corticosteroids.  She is here for a Singulair refill.  She has not had  any active complaints, active wheezing, cough, or shortness of breath.   PAST MEDICAL HISTORY:  No change in the past medical history from  previous exams.   CURRENT MEDICATIONS:  1. Multivitamin daily.  2. Singulair 10 mg daily.  3. Allegra D 60 mg, is now off.   PHYSICAL EXAMINATION:  GENERAL:  This is a middle aged African-American  female in no distress.  VITAL SIGNS:  Temp 98, blood pressure 130/90, pulse 101, saturation was  98% on room air.  CHEST:  Completely clear to auscultation and percussion.  There was no  evidence of wheeze, rale or rhonchi.  CARDIAC:  Regular rate and rhythm without S3, normal S1, S2.  ABDOMEN:  Soft, nontender.  EXTREMITIES:  No edema or clubbing, no venous disease.  SKIN:  Clear.  NEUROLOGIC:  Intact.  HEENT:  No jugular venous distention, no lymphadenopathy, oropharynx  clear.  NECK:  Supple.   IMPRESSION:  Stable air flow function with moderate persistent asthma.   PLAN:  Maintain inhaled medicines as currently dosed, maintain Singulair  10 mg as currently dosed.  We will see this patient back return followup  in 6 months.     Charlcie Cradle Delford Field, MD, [facility]  Electronically Signed    PEW/MedQ  DD: 10/21/2006  DT: 10/21/2006  Job #: [id number]

## 2010-11-28 NOTE — Op Note (Signed)
[facility]  Patient:    Lauren Carroll, Lauren Carroll                       MRN: [id number] Proc. Date: 09/09/99 Adm. Date:  [id number] Attending:  Dierdre Forth Pearline                           Operative Report  PREOPERATIVE DIAGNOSIS:       Fetal tachycardia, maternal temperature.  OBSTETRICIAN:                 Cecilio Asper, M.D.  OPERATION:                    Low forceps vaginal delivery.  ANESTHESIA:                   Epidural.  FINDINGS:                     Viable female infant, named Gretchen Portela. Apgars 8 and 9, weight 6 pounds 6 ounces.  Placenta appears intact with three-vessel cord. Episiotomy is none.  There was a fourth-degree laceration.  ESTIMATED BLOOD LOSS:         400 cc.  INDICATIONS:                  The patient is a 40 year old, gravida 1, para 0, ho presents at term for induction of labor secondary to a history of mixed connective tissue disorder.  The patient slowly progressed to 10 cm of dilatation with Pitocin augmentation.  She began her second stage without complication and pushed for approximately two hours.  The patients maximum temperature was 101.5.  Fetal heart rate was in the 190s to 200.  There was good variability but the patient was making very slow progress.  The pelvis is felt to be adequate, maternal pushing effort was maximum.  Contractions were regular and strong on 1 mU/min of Pitocin.  The vertex was examined and found to be in a direct occiput posterior position.  At this point the patient was consent for an operative vaginal delivery in the form of forceps.  DESCRIPTION OF PROCEDURE:     The patient was placed in the stirrups and a red rubber catheter was used to drain the bladder of urine.  Pelvic examination again confirmed occiput posterior position.  The Luikart-Simpson forceps were placed without difficulty.  The sagittal suture was noted to be in the middle of the forceps blade.   With the next contraction, my right hand on the handles and my eft hand on the forceps shank, moderate traction was applied and the patient pushed the vertex to a +3 station.  With the next contraction the infant was delivered. She was suctioned on the operative field.  There was a nuchal cord x 1.  Anterior posterior shoulders were then delivered and the infant was carried to the warmer where she was cared for by the respiratory team.  Cord bloods were obtained. Placenta delivered without difficulty.  The uterus firmed up nicely with Pitocin. Inspection of the vagina and perineum revealed no sulcus lacerations but there as a fourth-degree midline laceration.  This was repaired with 1% lidocaine for anesthesia and #3-0 Vicryl without difficulty in normal fashion.  The patient tolerated the procedure well.  At the time of this dictation, mom is recovering  without difficulty and the infant is in the  newborn nursery under observation. DD:  09/09/99 TD:  09/10/99 Job: [id number]

## 2010-11-28 NOTE — H&P (Signed)
[facility]  Patient:    Lauren Carroll, Lauren Carroll                       MRN: [id number] Adm. Date:  [id number] Disc. Date: [id number] Attending:  Shaune Spittle Dictator:   Maggie Schwalbe, C.N.M.                         History and Physical  DATE OF BIRTH:                [birth year].  HISTORY OF PRESENT ILLNESS:   This is a 40 year old gravida 1, para 1, who is five days postpartum who experienced a late postpartum hemorrhage today at home. 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  She noticed that she saturated a bath towel and blood was running down her legs.  She has had an uncomplicated postpartum course until today.  She has a history of a connective tissue disorder with respiratory involvement and fibromyalgia.  She has been taking Atrovent p.r.n. nd Flovent p.r.n.  Currently she has not had any respiratory distress.  She was diagnosed with positive lupus antibodies during this pregnancy and received one  baby aspirin daily for her pregnancy.  Since delivery she has been on ibuprofen  p.r.n., has not experienced any fevers or abdominal pain other than this severe  menstrual-like cramping that preceded her hemorrhaging.  She is currently bottle feeding.  She has had no change to her physical activity and has been resting at home.  LABORATORY:                   Hemoglobin 11.2, hematocrit 34.6, platelets 259, white count 8.4.  Her CBC following delivery on March 1:  Hemoglobin 10.7, hematocrit 31.5, platelets 202, white count 12.7.  MEDICAL HISTORY:              History of asthma, connective tissue disorder, and fibromyalgia, use of inhaler p.r.n.  She was diagnosed in 2000 with connective tissue disease by Dr. Chase Picket and has been following up.  She was initially  placed on prednisone treatment and this was discontinued for her pregnancy. The plan was to follow up six months after delivery, because she appeared to be  in stable condition.  She experienced a wisdom tooth extraction and umbilical hernia repair without complication.  Forceps delivery on February 27 of a viable baby girl, weighing 6 pounds 6 ounces, Apgars 8 and 9, with a midline episiotomy with no extensions.  This was under epidural anesthesia.  This labor was significant for Pitocin augmentation.  FAMILY HISTORY:               Significant for mother, maternal grandfather, and  father with heart disease; mother, father, and brother with high blood pressure; father with non-insulin dependent diabetes mellitus as well as maternal grandparents.  SOCIAL HISTORY:               Married to [employer], African-American.  He is present and supportive.  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  Father of the baby is a Restaurant manager, fast food, Engineer, maintenance (IT), full time, stable monogamous relationship.  Denies smoking, alcohol, or drug abuse.  PHYSICAL EXAMINATION:  VITAL SIGNS:                  Temperature 98.1, pulse 92, respirations 26, blood pressure 131/71.  HEENT:                        Within normal limits.  LUNGS:                        Bilaterally clear.  HEART:                        Regular rate and rhythm.  ABDOMEN:                      Soft and nontender, other than menstrual like cramps. Uterus is approximately 12 weeks size, well and diluted and firm.  PELVIC:                       Cervix is slightly open.  Large amount of clots were evacuated from the vaginal vault.  Approximately 400 cc blood loss over a period of 3 minutes.  EXTREMITIES:                  Trace edema.  DTRs +1.  ASSESSMENT:                   Delayed postpartum hemorrhage, five days postpartum. Methergine administered with relief.  IV with Pitocin 1000 cc bolused with good  results.  Bleeding has decreased.  Patient desires evaluation over a 23 hour observational period.  She is awaiting  discussions with Dr. Stefano Gaul regarding er status and prognosis. DD:  09/14/99 TD:  09/14/99 Job: [id number]

## 2011-01-26 ENCOUNTER — Other Ambulatory Visit: Payer: Self-pay | Admitting: Obstetrics and Gynecology

## 2011-01-26 DIAGNOSIS — N6321 Unspecified lump in the left breast, upper outer quadrant: Secondary | ICD-10-CM

## 2011-01-30 ENCOUNTER — Ambulatory Visit
Admission: RE | Admit: 2011-01-30 | Discharge: 2011-01-30 | Disposition: A | Discharge status: Home / Self Care | Payer: BC Managed Care – HMO | Source: Ambulatory Visit | Attending: Obstetrics and Gynecology | Admitting: Obstetrics and Gynecology

## 2011-01-30 DIAGNOSIS — N6321 Unspecified lump in the left breast, upper outer quadrant: Secondary | ICD-10-CM

## 2011-03-19 ENCOUNTER — Other Ambulatory Visit: Payer: Self-pay | Admitting: Critical Care Medicine

## 2011-05-03 ENCOUNTER — Other Ambulatory Visit: Payer: Self-pay | Admitting: Critical Care Medicine

## 2011-05-27 ENCOUNTER — Other Ambulatory Visit: Payer: Self-pay | Admitting: Internal Medicine

## 2011-06-02 ENCOUNTER — Other Ambulatory Visit: Payer: Self-pay | Admitting: *Deleted

## 2011-06-03 ENCOUNTER — Ambulatory Visit (INDEPENDENT_AMBULATORY_CARE_PROVIDER_SITE_OTHER): Payer: BC Managed Care – HMO

## 2011-06-03 DIAGNOSIS — Z23 Encounter for immunization: Secondary | ICD-10-CM

## 2011-06-12 ENCOUNTER — Other Ambulatory Visit (HOSPITAL_COMMUNITY): Payer: BC Managed Care – HMO

## 2011-06-19 ENCOUNTER — Other Ambulatory Visit: Payer: BC Managed Care – HMO

## 2011-06-29 ENCOUNTER — Ambulatory Visit (HOSPITAL_COMMUNITY)
Admission: RE | Admit: 2011-06-29 | Discharge: 2011-06-29 | Disposition: A | Discharge status: Home / Self Care | Payer: BC Managed Care – HMO | Source: Ambulatory Visit | Attending: Internal Medicine | Admitting: Internal Medicine

## 2011-06-29 DIAGNOSIS — R1310 Dysphagia, unspecified: Secondary | ICD-10-CM | POA: Insufficient documentation

## 2011-06-30 ENCOUNTER — Other Ambulatory Visit: Payer: BC Managed Care – HMO

## 2011-07-23 ENCOUNTER — Other Ambulatory Visit: Payer: Self-pay | Admitting: Obstetrics and Gynecology

## 2011-07-23 DIAGNOSIS — N63 Unspecified lump in unspecified breast: Secondary | ICD-10-CM

## 2011-07-30 ENCOUNTER — Telehealth: Payer: Self-pay | Admitting: Critical Care Medicine

## 2011-07-30 NOTE — Telephone Encounter (Signed)
Pt instructed to call insurance company to see what drug is preferred in place of Flovent and then let us know.  Pt to call back with this info.

## 2011-07-31 NOTE — Telephone Encounter (Signed)
Pt says per her insurance formulary alternatives to Flovent Diskus are:  Asmanex, Qvar and Pulmicort Flexhaler. All 3 are covered at a lower cost and need no PA. She is aware we will not give her a callback until next week. PW is not in the office. Please advise. Allergies  Allergen Reactions  . Cephalosporins   . Penicillins   . Sulfa Drugs Cross Reactors     itching

## 2011-08-01 NOTE — Telephone Encounter (Signed)
Change to asmanex two puff daily 

## 2011-08-03 NOTE — Telephone Encounter (Signed)
Patient made aware of inhaler change from Flovent to Asmanex. She will be taking 2 puffs daily. I have left 2 samples for the patient to come by and pick up. She will also ask for one of the nurses to demonstrate how to use this inhaler as well. Patient will call before running out of the sample to report how she is doing and then request RX be called to her pharmacy at that time. Medication list has been updated to reflect the change.

## 2011-08-13 ENCOUNTER — Telehealth: Payer: Self-pay | Admitting: Critical Care Medicine

## 2011-08-13 MED ORDER — MOMETASONE FUROATE 220 MCG/INH IN AEPB: 2.0000 | INHALATION_SPRAY | Freq: Every day | RESPIRATORY_TRACT | Status: DC

## 2011-08-13 NOTE — Telephone Encounter (Signed)
Spoke with pt. She states that asmanex works very well. Needs rx called in b/c her sample runs out. Rx was sent to pharm. Nothing further needed.

## 2011-08-25 ENCOUNTER — Ambulatory Visit
Admission: RE | Admit: 2011-08-25 | Discharge: 2011-08-25 | Disposition: A | Discharge status: Home / Self Care | Payer: BC Managed Care – PPO | Source: Ambulatory Visit | Attending: Obstetrics and Gynecology | Admitting: Obstetrics and Gynecology

## 2011-08-25 DIAGNOSIS — N63 Unspecified lump in unspecified breast: Secondary | ICD-10-CM

## 2011-10-18 ENCOUNTER — Other Ambulatory Visit: Payer: Self-pay | Admitting: Critical Care Medicine

## 2011-10-19 NOTE — Telephone Encounter (Signed)
Pt was last seen by Dr. Delford Field on 10/08/10.  She does have a pending appt with him on 10/28/11.  I sent rx to pharm with comment pt needs to keep appt.

## 2011-10-28 ENCOUNTER — Encounter: Payer: Self-pay | Admitting: Critical Care Medicine

## 2011-10-28 ENCOUNTER — Ambulatory Visit (INDEPENDENT_AMBULATORY_CARE_PROVIDER_SITE_OTHER): Payer: BC Managed Care – PPO | Admitting: Critical Care Medicine

## 2011-10-28 VITALS — BP 106/72 | HR 80 | Temp 98.4°F | Ht 61.0 in | Wt 192.4 lb

## 2011-10-28 DIAGNOSIS — J45909 Unspecified asthma, uncomplicated: Secondary | ICD-10-CM

## 2011-10-28 MED ORDER — MOMETASONE FUROATE 220 MCG/INH IN AEPB: 2.0000 | INHALATION_SPRAY | Freq: Every day | RESPIRATORY_TRACT | Status: DC

## 2011-10-28 MED ORDER — ALBUTEROL SULFATE HFA 108 (90 BASE) MCG/ACT IN AERS: 2.0000 | INHALATION_SPRAY | RESPIRATORY_TRACT | Status: DC | PRN

## 2011-10-28 MED ORDER — MONTELUKAST SODIUM 10 MG PO TABS: 10.0000 mg | ORAL_TABLET | Freq: Every day | ORAL | Status: DC

## 2011-10-28 NOTE — Assessment & Plan Note (Addendum)
Stable asthma,  Weight is an issue.   Plan Diet counselling   Continue Inhaled medications as prescribed

## 2011-10-28 NOTE — Progress Notes (Signed)
Subjective:    Patient ID: Lauren Carroll, female    DOB: 1971/04/08, 41 y.o.   MRN: [id number]  Shortness of Breath This is a chronic problem. The current episode started more than 1 year ago. Episode frequency: exertional only, up 4  flights of stairs, on level ground is dyspneic 1 block,  The problem has been unchanged. Pertinent negatives include no chest pain, fever, headaches, hemoptysis, orthopnea, PND, rhinorrhea, sore throat, sputum production, vomiting or wheezing. The symptoms are aggravated by exercise. Associated symptoms comments: No pn drip, no nasal issues . She has tried steroid inhalers for the symptoms. The treatment provided moderate relief. Her past medical history is significant for allergies and asthma.   This is a 41 y.o.   African female with mild intermittent asthma.   10/08/10 Since last OV 5/11:  Not much change, now weight issues,  Has to walk long distance to her office and is dyspneic.  Also an issue walking up 4 flights of stairs.  See above for symptoms   10/28/2011 Not seen since 3/12.    Since last OV, breathing ok.  Weight is an issue. No real cough. Uses rescue inhaler only before running. Pt denies any significant sore throat, nasal congestion or excess secretions, fever, chills, sweats, unintended weight loss, pleurtic or exertional chest pain, orthopnea PND, or leg swelling Pt denies any increase in rescue therapy over baseline, denies waking up needing it or having any early am or nocturnal exacerbations of coughing/wheezing/or dyspnea. Pt also denies any obvious fluctuation in symptoms with  weather or environmental change or other alleviating or aggravating factors  PUL ASTHMA HISTORY 10/28/2011  Symptoms 0-2 days/week  Nighttime awakenings 0-2/month  Interference with activity No limitations  SABA use 0-2 days/wk  Exacerbations requiring oral steroids 0-1 / year       Review of Systems  Constitutional: Negative for fever.  HENT: Negative for  sore throat and rhinorrhea.   Respiratory: Positive for shortness of breath. Negative for hemoptysis, sputum production and wheezing.   Cardiovascular: Negative for chest pain, orthopnea and PND.  Gastrointestinal: Negative for vomiting.  Neurological: Negative for headaches.   Past Medical History  Diagnosis Date  . Asthmatic bronchitis   . Asthma   . History of URI (upper respiratory infection)   . Allergic rhinitis   . Overactive bladder      No family history on file.   History   Social History  . Marital Status: Married    Spouse Name: N/A    Number of Children: N/A  . Years of Education: N/A   Occupational History  . Nurse Instructor RN    Social History Main Topics  . Smoking status: Never Smoker   . Smokeless tobacco: Never Used  . Alcohol Use: No  . Drug Use: No  . Sexually Active: Not on file   Other Topics Concern  . Not on file   Social History Narrative  . No narrative on file     Allergies  Allergen Reactions  . Cephalosporins   . Penicillins   . Sulfa Drugs Cross Reactors     itching     Outpatient Prescriptions Prior to Visit  Medication Sig Dispense Refill  . cetirizine (ZYRTEC) 10 MG tablet Take 10 mg by mouth daily.        Marland Kitchen eletriptan (RELPAX) 40 MG tablet One tablet by mouth as needed for migraine headache.  If the headache improves and then returns, dose may be repeated after 2  hours have elapsed since first dose (do not exceed 80 mg per day). may repeat in 2 hours if necessary       . Multiple Vitamin (MULTIVITAMINS PO) Take 1 tablet by mouth once a day       . albuterol (VENTOLIN HFA) 108 (90 BASE) MCG/ACT inhaler Inhale 2 puffs into the lungs every 4 (four) hours as needed for wheezing.  1 Inhaler  11  . ASMANEX 60 METERED DOSES 220 MCG/INH inhaler INHALE TWO PUFFS EVERY DAY  1 Inhaler  1  . mometasone (ASMANEX 60 METERED DOSES) 220 MCG/INH inhaler Inhale 2 puffs into the lungs daily.      Marland Kitchen SINGULAIR 10 MG tablet TAKE ONE TABLET BY  MOUTH EVERY DAY  30 each  4  . Azelastine HCl (ASTEPRO) 0.15 % SOLN As needed       . Calcium Carbonate-Vitamin D (CALTRATE 600+D) 600-400 MG-UNIT per tablet Take 1 tablet by mouth 2 (two) times daily.        . Cholecalciferol (VITAMIN D) 2000 UNITS CAPS Take by mouth daily.               Objective:   Physical Exam  Gen: Pleasant, obese , in no distress,  normal affect  ENT: No lesions,  mouth clear,  oropharynx clear, no postnasal drip  Neck: No JVD, no TMG, no carotid bruits  Lungs: No use of accessory muscles, no dullness to percussion, clear without rales or rhonchi  Cardiovascular: RRR, heart sounds normal, no murmur or gallops, no peripheral edema  Abdomen: soft and NT, no HSM,  BS normal  Musculoskeletal: No deformities, no cyanosis or clubbing  Neuro: alert, non focal  Skin: Warm, no lesions or rashes         Assessment & Plan:   ASTHMA Stable asthma,  Weight is an issue.   Plan Diet counselling   Continue Inhaled medications as prescribed      Updated Medication List Outpatient Encounter Prescriptions as of 10/28/2011  Medication Sig Dispense Refill  . albuterol (VENTOLIN HFA) 108 (90 BASE) MCG/ACT inhaler Inhale 2 puffs into the lungs every 4 (four) hours as needed for wheezing.  1 Inhaler  11  . cetirizine (ZYRTEC) 10 MG tablet Take 10 mg by mouth daily.        Marland Kitchen eletriptan (RELPAX) 40 MG tablet One tablet by mouth as needed for migraine headache.  If the headache improves and then returns, dose may be repeated after 2 hours have elapsed since first dose (do not exceed 80 mg per day). may repeat in 2 hours if necessary       . mometasone (ASMANEX 60 METERED DOSES) 220 MCG/INH inhaler Inhale 2 puffs into the lungs daily.  1 Inhaler  11  . montelukast (SINGULAIR) 10 MG tablet Take 1 tablet (10 mg total) by mouth at bedtime.  30 tablet  11  . Multiple Vitamin (MULTIVITAMINS PO) Take 1 tablet by mouth once a day       . DISCONTD: albuterol (VENTOLIN HFA)  108 (90 BASE) MCG/ACT inhaler Inhale 2 puffs into the lungs every 4 (four) hours as needed for wheezing.  1 Inhaler  11  . DISCONTD: ASMANEX 60 METERED DOSES 220 MCG/INH inhaler INHALE TWO PUFFS EVERY DAY  1 Inhaler  1  . DISCONTD: mometasone (ASMANEX 60 METERED DOSES) 220 MCG/INH inhaler Inhale 2 puffs into the lungs daily.      Marland Kitchen DISCONTD: SINGULAIR 10 MG tablet TAKE ONE TABLET BY MOUTH EVERY  DAY  30 each  4  . DISCONTD: Azelastine HCl (ASTEPRO) 0.15 % SOLN As needed       . DISCONTD: Calcium Carbonate-Vitamin D (CALTRATE 600+D) 600-400 MG-UNIT per tablet Take 1 tablet by mouth 2 (two) times daily.        Marland Kitchen DISCONTD: Cholecalciferol (VITAMIN D) 2000 UNITS CAPS Take by mouth daily.

## 2011-10-28 NOTE — Patient Instructions (Signed)
No change in medications. Return in       12 months  

## 2012-04-25 ENCOUNTER — Telehealth: Payer: Self-pay | Admitting: Critical Care Medicine

## 2012-04-25 MED ORDER — MOMETASONE FUROATE 220 MCG/INH IN AEPB: 2.0000 | INHALATION_SPRAY | Freq: Every day | RESPIRATORY_TRACT | Status: DC

## 2012-04-25 NOTE — Telephone Encounter (Signed)
Called and spoke with patient, requesting Asmanex sample.  Told patient would leave sample at front for pick up, nothing further needed at this time.

## 2012-05-24 ENCOUNTER — Ambulatory Visit: Payer: BC Managed Care – PPO

## 2012-10-29 ENCOUNTER — Other Ambulatory Visit: Payer: Self-pay | Admitting: Critical Care Medicine

## 2012-11-01 NOTE — Telephone Encounter (Signed)
Last OV with PW 10/28/11 Pt does have a pending OV with him on 11/15/12. Singulair rx sent.

## 2012-11-11 ENCOUNTER — Other Ambulatory Visit: Payer: Self-pay

## 2012-11-11 DIAGNOSIS — Z1231 Encounter for screening mammogram for malignant neoplasm of breast: Secondary | ICD-10-CM

## 2012-11-13 ENCOUNTER — Other Ambulatory Visit: Payer: Self-pay | Admitting: Critical Care Medicine

## 2012-11-15 ENCOUNTER — Encounter: Payer: Self-pay | Admitting: Critical Care Medicine

## 2012-11-15 ENCOUNTER — Ambulatory Visit (INDEPENDENT_AMBULATORY_CARE_PROVIDER_SITE_OTHER): Payer: BC Managed Care – PPO | Admitting: Critical Care Medicine

## 2012-11-15 VITALS — BP 142/78 | HR 76 | Temp 97.7°F | Ht 61.0 in | Wt 186.0 lb

## 2012-11-15 DIAGNOSIS — J45909 Unspecified asthma, uncomplicated: Secondary | ICD-10-CM

## 2012-11-15 MED ORDER — MONTELUKAST SODIUM 10 MG PO TABS: 10.0000 mg | ORAL_TABLET | Freq: Every day | ORAL | Status: DC

## 2012-11-15 MED ORDER — MOMETASONE FUROATE 220 MCG/INH IN AEPB: 2.0000 | INHALATION_SPRAY | Freq: Every day | RESPIRATORY_TRACT | Status: DC

## 2012-11-15 NOTE — Assessment & Plan Note (Signed)
Moderate persistent asthma stable at this time and atopic in nature Plan Maintain inhaled medications as prescribed Return 1 year

## 2012-11-15 NOTE — Progress Notes (Signed)
Subjective:    Patient ID: Lauren Carroll, female    DOB: 1971/05/24, 42 y.o.   MRN: [id number]  Shortness of Breath This is a chronic problem. The current episode started more than 1 year ago. Episode frequency: exertional only, up 4  flights of stairs, on level ground is dyspneic 1 block,  The problem has been unchanged. Pertinent negatives include no orthopnea, sore throat, vomiting or wheezing. The symptoms are aggravated by exercise. Associated symptoms comments: No pn drip, no nasal issues . She has tried steroid inhalers for the symptoms. The treatment provided moderate relief. Her past medical history is significant for allergies.   This is a 42 y.o.   African female with mild intermittent asthma.    11/15/2012 Since the last visit in April 2013 the patient has done well. There's been no exacerbations. There is no excess cough. There is no excess use of rescue inhaler. The patient denies any new complaints.  PUL ASTHMA HISTORY 11/15/2012 10/28/2011  Symptoms 0-2 days/week 0-2 days/week  Nighttime awakenings 0-2/month 0-2/month  Interference with activity No limitations No limitations  SABA use 0-2 days/wk 0-2 days/wk  Exacerbations requiring oral steroids 0-1 / year 0-1 / year       Review of Systems  HENT: Negative for sore throat.   Respiratory: Negative for cough, choking, chest tightness, shortness of breath and wheezing.   Cardiovascular: Negative for orthopnea.  Gastrointestinal: Negative for vomiting.   Past Medical History  Diagnosis Date  . Asthmatic bronchitis   . Asthma   . History of URI (upper respiratory infection)   . Allergic rhinitis   . Overactive bladder      History reviewed. No pertinent family history.   History   Social History  . Marital Status: Married    Spouse Name: N/A    Number of Children: N/A  . Years of Education: N/A   Occupational History  . Nurse Instructor RN    Social History Main Topics  . Smoking status: Never Smoker    . Smokeless tobacco: Never Used  . Alcohol Use: No  . Drug Use: No  . Sexually Active: Not on file   Other Topics Concern  . Not on file   Social History Narrative  . No narrative on file     Allergies  Allergen Reactions  . Cephalosporins   . Penicillins   . Sulfa Drugs Cross Reactors     itching     Outpatient Prescriptions Prior to Visit  Medication Sig Dispense Refill  . albuterol (VENTOLIN HFA) 108 (90 BASE) MCG/ACT inhaler Inhale 2 puffs into the lungs every 4 (four) hours as needed for wheezing.  1 Inhaler  11  . cetirizine (ZYRTEC) 10 MG tablet Take 10 mg by mouth daily.        . Multiple Vitamin (MULTIVITAMINS PO) Take 1 tablet by mouth once a day       . ASMANEX 60 METERED DOSES 220 MCG/INH inhaler INHALE TWO PUFFS EVERY DAY  1 Inhaler  0  . montelukast (SINGULAIR) 10 MG tablet TAKE ONE TABLET BY MOUTH AT BEDTIME  30 tablet  0  . eletriptan (RELPAX) 40 MG tablet One tablet by mouth as needed for migraine headache.  If the headache improves and then returns, dose may be repeated after 2 hours have elapsed since first dose (do not exceed 80 mg per day). may repeat in 2 hours if necessary       . mometasone (ASMANEX 60 METERED DOSES) 220  MCG/INH inhaler Inhale 2 puffs into the lungs daily.  2 Inhaler  0   No facility-administered medications prior to visit.         Objective:   Physical Exam BP 142/78  Pulse 76  Temp(Src) 97.7 F (36.5 C) (Oral)  Ht 5\' 1"  (1.549 m)  Wt 186 lb (84.369 kg)  BMI 35.16 kg/m2  SpO2 99%  Gen: Pleasant, obese , in no distress,  normal affect  ENT: No lesions,  mouth clear,  oropharynx clear, no postnasal drip  Neck: No JVD, no TMG, no carotid bruits  Lungs: No use of accessory muscles, no dullness to percussion, clear without rales or rhonchi  Cardiovascular: RRR, heart sounds normal, no murmur or gallops, no peripheral edema  Abdomen: soft and NT, no HSM,  BS normal  Musculoskeletal: No deformities, no cyanosis or  clubbing  Neuro: alert, non focal  Skin: Warm, no lesions or rashes         Assessment & Plan:   ASTHMA Moderate persistent asthma stable at this time and atopic in nature Plan Maintain inhaled medications as prescribed Return 1 year    Updated Medication List Outpatient Encounter Prescriptions as of 11/15/2012  Medication Sig Dispense Refill  . albuterol (VENTOLIN HFA) 108 (90 BASE) MCG/ACT inhaler Inhale 2 puffs into the lungs every 4 (four) hours as needed for wheezing.  1 Inhaler  11  . cetirizine (ZYRTEC) 10 MG tablet Take 10 mg by mouth daily.        . mometasone (ASMANEX 60 METERED DOSES) 220 MCG/INH inhaler Inhale 2 puffs into the lungs daily.  1 Inhaler  0  . montelukast (SINGULAIR) 10 MG tablet Take 1 tablet (10 mg total) by mouth at bedtime.  30 tablet  11  . Multiple Vitamin (MULTIVITAMINS PO) Take 1 tablet by mouth once a day       . [DISCONTINUED] ASMANEX 60 METERED DOSES 220 MCG/INH inhaler INHALE TWO PUFFS EVERY DAY  1 Inhaler  0  . [DISCONTINUED] montelukast (SINGULAIR) 10 MG tablet TAKE ONE TABLET BY MOUTH AT BEDTIME  30 tablet  0  . eletriptan (RELPAX) 40 MG tablet One tablet by mouth as needed for migraine headache.  If the headache improves and then returns, dose may be repeated after 2 hours have elapsed since first dose (do not exceed 80 mg per day). may repeat in 2 hours if necessary       . [DISCONTINUED] mometasone (ASMANEX 60 METERED DOSES) 220 MCG/INH inhaler Inhale 2 puffs into the lungs daily.  2 Inhaler  0   No facility-administered encounter medications on file as of 11/15/2012.

## 2012-11-15 NOTE — Patient Instructions (Addendum)
No change in medications. Return in              12 months Refills sent to pharmacy

## 2012-12-22 ENCOUNTER — Ambulatory Visit
Admission: RE | Admit: 2012-12-22 | Discharge: 2012-12-22 | Disposition: A | Discharge status: Home / Self Care | Payer: BC Managed Care – PPO | Source: Ambulatory Visit

## 2012-12-22 DIAGNOSIS — Z1231 Encounter for screening mammogram for malignant neoplasm of breast: Secondary | ICD-10-CM

## 2013-01-08 ENCOUNTER — Other Ambulatory Visit: Payer: Self-pay | Admitting: Critical Care Medicine

## 2013-01-27 ENCOUNTER — Other Ambulatory Visit: Payer: Self-pay | Admitting: Critical Care Medicine

## 2013-02-03 ENCOUNTER — Telehealth: Payer: Self-pay | Admitting: Critical Care Medicine

## 2013-02-03 NOTE — Telephone Encounter (Signed)
Pt is aware that this was sent to her pharmacy on 01/27/13.

## 2013-11-19 ENCOUNTER — Other Ambulatory Visit: Payer: Self-pay | Admitting: Critical Care Medicine

## 2013-11-20 NOTE — Telephone Encounter (Signed)
Last OV with PW: 11/15/13; asked to f/u in 1 yr No pending appt. Called, spoke with pt. We have scheduled her to see PW June [facility] 10[facility] - pt aware and ok with this date.  Will send rx to last until June appt.  Pt asked to please keep appt for further refills.  She verbalized understanding and voiced no further questions or concerns at this time.

## 2013-11-27 ENCOUNTER — Other Ambulatory Visit: Payer: Self-pay

## 2013-11-27 DIAGNOSIS — Z1231 Encounter for screening mammogram for malignant neoplasm of breast: Secondary | ICD-10-CM

## 2013-12-15 ENCOUNTER — Ambulatory Visit: Payer: BC Managed Care – PPO | Admitting: Critical Care Medicine

## 2013-12-27 ENCOUNTER — Ambulatory Visit
Admission: RE | Admit: 2013-12-27 | Discharge: 2013-12-27 | Disposition: A | Discharge status: Home / Self Care | Payer: BC Managed Care – PPO | Source: Ambulatory Visit

## 2013-12-27 DIAGNOSIS — Z1231 Encounter for screening mammogram for malignant neoplasm of breast: Secondary | ICD-10-CM

## 2014-04-24 ENCOUNTER — Ambulatory Visit: Payer: BC Managed Care – PPO | Admitting: Internal Medicine

## 2014-05-20 ENCOUNTER — Other Ambulatory Visit: Payer: Self-pay | Admitting: Critical Care Medicine

## 2014-05-24 NOTE — Telephone Encounter (Signed)
Pt last seen PW 11/15/2012; asked to f/u in 1 yr No pending OV. [email or web address] on pt's home and cell #s to schedule yearly follow up. Will send rx to pharm with request to schedule OV as well.

## 2014-05-24 NOTE — Telephone Encounter (Signed)
Informed pt that refill was sent to pharmacy.  Pt will call to schedule f/u with PW once her finances will allow it.

## 2014-06-13 ENCOUNTER — Other Ambulatory Visit: Payer: Self-pay | Admitting: Obstetrics and Gynecology

## 2014-06-13 DIAGNOSIS — N6320 Unspecified lump in the left breast, unspecified quadrant: Secondary | ICD-10-CM

## 2014-06-26 ENCOUNTER — Ambulatory Visit
Admission: RE | Admit: 2014-06-26 | Discharge: 2014-06-26 | Disposition: A | Discharge status: Home / Self Care | Payer: BC Managed Care – PPO | Source: Ambulatory Visit | Attending: Obstetrics and Gynecology | Admitting: Obstetrics and Gynecology

## 2014-06-26 DIAGNOSIS — N6320 Unspecified lump in the left breast, unspecified quadrant: Secondary | ICD-10-CM

## 2015-02-15 ENCOUNTER — Other Ambulatory Visit: Payer: Self-pay

## 2015-02-15 DIAGNOSIS — Z1231 Encounter for screening mammogram for malignant neoplasm of breast: Secondary | ICD-10-CM

## 2015-03-26 ENCOUNTER — Ambulatory Visit
Admission: RE | Admit: 2015-03-26 | Discharge: 2015-03-26 | Disposition: A | Discharge status: Home / Self Care | Payer: BC Managed Care – PPO | Source: Ambulatory Visit

## 2015-03-26 DIAGNOSIS — Z1231 Encounter for screening mammogram for malignant neoplasm of breast: Secondary | ICD-10-CM

## 2016-04-21 ENCOUNTER — Other Ambulatory Visit: Payer: Self-pay | Admitting: Obstetrics and Gynecology

## 2016-04-21 DIAGNOSIS — Z1231 Encounter for screening mammogram for malignant neoplasm of breast: Secondary | ICD-10-CM

## 2016-05-14 ENCOUNTER — Ambulatory Visit: Payer: BC Managed Care – PPO

## 2016-06-12 ENCOUNTER — Ambulatory Visit
Admission: RE | Admit: 2016-06-12 | Discharge: 2016-06-12 | Disposition: A | Discharge status: Home / Self Care | Payer: BC Managed Care – PPO | Source: Ambulatory Visit | Attending: Obstetrics and Gynecology | Admitting: Obstetrics and Gynecology

## 2016-06-12 DIAGNOSIS — Z1231 Encounter for screening mammogram for malignant neoplasm of breast: Secondary | ICD-10-CM

## 2017-07-14 ENCOUNTER — Other Ambulatory Visit: Payer: Self-pay | Admitting: Obstetrics and Gynecology

## 2017-07-14 DIAGNOSIS — Z1231 Encounter for screening mammogram for malignant neoplasm of breast: Secondary | ICD-10-CM

## 2017-08-04 ENCOUNTER — Ambulatory Visit
Admission: RE | Admit: 2017-08-04 | Discharge: 2017-08-04 | Disposition: A | Discharge status: Home / Self Care | Payer: BC Managed Care – PPO | Source: Ambulatory Visit | Attending: Obstetrics and Gynecology | Admitting: Obstetrics and Gynecology

## 2017-08-04 DIAGNOSIS — Z1231 Encounter for screening mammogram for malignant neoplasm of breast: Secondary | ICD-10-CM

## 2018-04-12 ENCOUNTER — Ambulatory Visit: Payer: Self-pay | Admitting: Internal Medicine

## 2018-04-12 ENCOUNTER — Ambulatory Visit: Payer: Self-pay | Admitting: Nurse Practitioner

## 2018-04-15 ENCOUNTER — Other Ambulatory Visit: Payer: Self-pay | Admitting: Family Medicine

## 2018-04-15 DIAGNOSIS — R519 Headache, unspecified: Secondary | ICD-10-CM

## 2018-04-15 DIAGNOSIS — M542 Cervicalgia: Secondary | ICD-10-CM

## 2018-04-15 DIAGNOSIS — R51 Headache: Principal | ICD-10-CM

## 2018-05-09 ENCOUNTER — Other Ambulatory Visit: Payer: Self-pay | Admitting: Internal Medicine

## 2018-06-05 ENCOUNTER — Other Ambulatory Visit: Payer: Self-pay | Admitting: Internal Medicine

## 2018-06-27 ENCOUNTER — Other Ambulatory Visit: Payer: Self-pay | Admitting: Obstetrics and Gynecology

## 2018-06-27 DIAGNOSIS — Z1231 Encounter for screening mammogram for malignant neoplasm of breast: Secondary | ICD-10-CM

## 2018-07-18 ENCOUNTER — Encounter: Payer: Self-pay | Admitting: Internal Medicine

## 2018-07-22 ENCOUNTER — Ambulatory Visit (INDEPENDENT_AMBULATORY_CARE_PROVIDER_SITE_OTHER): Payer: BC Managed Care – PPO

## 2018-07-22 ENCOUNTER — Other Ambulatory Visit: Payer: Self-pay

## 2018-07-22 VITALS — BP 128/76 | HR 73 | Temp 97.6°F | Ht 59.8 in | Wt 183.2 lb

## 2018-07-22 DIAGNOSIS — Z23 Encounter for immunization: Secondary | ICD-10-CM

## 2018-08-05 ENCOUNTER — Ambulatory Visit: Payer: BC Managed Care – PPO

## 2018-08-24 ENCOUNTER — Ambulatory Visit
Admission: RE | Admit: 2018-08-24 | Discharge: 2018-08-24 | Disposition: A | Discharge status: Home / Self Care | Payer: BC Managed Care – PPO | Source: Ambulatory Visit | Attending: Obstetrics and Gynecology | Admitting: Obstetrics and Gynecology

## 2018-08-24 DIAGNOSIS — Z1231 Encounter for screening mammogram for malignant neoplasm of breast: Secondary | ICD-10-CM

## 2018-08-25 ENCOUNTER — Other Ambulatory Visit: Payer: Self-pay | Admitting: Obstetrics and Gynecology

## 2018-08-25 DIAGNOSIS — R928 Other abnormal and inconclusive findings on diagnostic imaging of breast: Secondary | ICD-10-CM

## 2018-09-01 ENCOUNTER — Other Ambulatory Visit: Payer: Self-pay | Admitting: Obstetrics and Gynecology

## 2018-09-01 ENCOUNTER — Other Ambulatory Visit: Payer: Self-pay

## 2018-09-01 DIAGNOSIS — R928 Other abnormal and inconclusive findings on diagnostic imaging of breast: Secondary | ICD-10-CM

## 2018-09-02 ENCOUNTER — Other Ambulatory Visit: Payer: BC Managed Care – PPO

## 2018-09-22 ENCOUNTER — Other Ambulatory Visit: Payer: Self-pay

## 2018-09-22 ENCOUNTER — Ambulatory Visit
Admission: RE | Admit: 2018-09-22 | Discharge: 2018-09-22 | Disposition: A | Discharge status: Home / Self Care | Payer: BC Managed Care – PPO | Source: Ambulatory Visit | Attending: Obstetrics and Gynecology | Admitting: Obstetrics and Gynecology

## 2018-09-22 DIAGNOSIS — R928 Other abnormal and inconclusive findings on diagnostic imaging of breast: Secondary | ICD-10-CM

## 2018-10-10 ENCOUNTER — Other Ambulatory Visit: Payer: Self-pay | Admitting: Internal Medicine

## 2018-10-10 MED ORDER — ALBUTEROL SULFATE HFA 108 (90 BASE) MCG/ACT IN AERS: INHALATION_SPRAY | RESPIRATORY_TRACT | 11 refills | Status: DC

## 2018-10-20 ENCOUNTER — Other Ambulatory Visit: Payer: Self-pay

## 2018-10-20 ENCOUNTER — Encounter: Payer: Self-pay | Admitting: Internal Medicine

## 2018-10-20 ENCOUNTER — Ambulatory Visit (INDEPENDENT_AMBULATORY_CARE_PROVIDER_SITE_OTHER): Payer: BC Managed Care – PPO | Admitting: Internal Medicine

## 2018-10-20 VITALS — BP 124/70 | HR 82 | Temp 98.7°F | Ht 59.0 in | Wt 177.0 lb

## 2018-10-20 DIAGNOSIS — Z7189 Other specified counseling: Secondary | ICD-10-CM

## 2018-10-20 DIAGNOSIS — J4520 Mild intermittent asthma, uncomplicated: Secondary | ICD-10-CM | POA: Diagnosis not present

## 2018-10-20 DIAGNOSIS — H1013 Acute atopic conjunctivitis, bilateral: Secondary | ICD-10-CM | POA: Diagnosis not present

## 2018-10-20 DIAGNOSIS — J301 Allergic rhinitis due to pollen: Secondary | ICD-10-CM | POA: Diagnosis not present

## 2018-10-20 DIAGNOSIS — Z6835 Body mass index (BMI) 35.0-35.9, adult: Secondary | ICD-10-CM

## 2018-10-20 MED ORDER — OFLOXACIN 0.3 % OP SOLN: 1.0000 [drp] | Freq: Four times a day (QID) | OPHTHALMIC | 0 refills | Status: DC

## 2018-10-20 NOTE — Patient Instructions (Signed)
Allergic Conjunctivitis  A clear membrane (conjunctiva) covers the white part of your eye and the inner surface of your eyelid. Allergic conjunctivitis happens when this membrane has inflammation. This is caused by allergies. Common causes of allergic reactions (allergens)include:  · Outdoor allergens, such as:  ? Pollen.  ? Grass and weeds.  ? Mold spores.  · Indoor allergens, such as:  ? Dust.  ? Smoke.  ? Mold.  ? Pet dander.  ? Animal hair.  This condition can make your eye red or pink. It can also make your eye feel itchy. This condition cannot be spread from one person to another person (is not contagious).  Follow these instructions at home:  · Try not to be around things that you are allergic to.  · Take or apply over-the-counter and prescription medicines only as told by your doctor. These include any eye drops.  · Place a cool, clean washcloth on your eye for 10-20 minutes. Do this 3-4 times a day.  · Do not touch or rub your eyes.  · Do not wear contact lenses until the inflammation is gone. Wear glasses instead.  · Do not wear eye makeup until the inflammation is gone.  · Keep all follow-up visits as told by your doctor. This is important.  Contact a doctor if:  · Your symptoms get worse.  · Your symptoms do not get better with treatment.  · You have mild eye pain.  · You are sensitive to light,  · You have spots or blisters on your eyes.  · You have pus coming from your eye.  · You have a fever.  Get help right away if:  · You have redness, swelling, or other symptoms in only one eye.  · Your vision is blurry.  · You have vision changes.  · You have very bad eye pain.  Summary  · Allergic conjunctivitis is caused by allergies. It can make your eye red or pink, and it can make your eye feel itchy.  · This condition cannot be spread from one person to another person (is not contagious).  · Try not to be around things that you are allergic to.  · Take or apply over-the-counter and prescription medicines  only as told by your doctor. These include any eye drops.  · Contact your doctor if your symptoms get worse or they do not get better with treatment.  This information is not intended to replace advice given to you by your health care provider. Make sure you discuss any questions you have with your health care provider.  Document Released: 12/17/2009 Document Revised: 02/21/2016 Document Reviewed: 02/21/2016  Elsevier Interactive Patient Education © 2019 [employer]vier Inc[employer]

## 2018-10-22 NOTE — Progress Notes (Signed)
Virtual Visit via Video Note   This visit type was conducted due to national recommendations for restrictions regarding the COVID-19 Pandemic (e.g. social distancing).  This format is felt to be most appropriate for this patient at this time.  All issues noted in this document were discussed and addressed.  No physical exam was performed (except for noted visual exam findings with Video Visits).  Please refer to the patient's chart (MyChart message for video visits and phone note for telephone visits) for the patient's consent to telehealth for [facility].  Date:  10/22/2018   ID:  Lauren Carroll, DOB Jun 19, 1971, MRN [id number]  Patient Location:  Home  Provider location:   Office    Chief Complaint:  Conjunctivitis  History of Present Illness:    Lauren Carroll is a 48 y.o. female who presents via video conferencing for a telehealth visit today.    The patient does not have symptoms concerning for COVID-19 infection (fever, chills, cough, or new shortness of breath).   Conjunctivitis   The current episode started 5 to 7 days ago. The onset was gradual. The problem has been unchanged. The problem is mild. Nothing relieves the symptoms. Associated symptoms include eye itching and eye redness. Pertinent negatives include no fever, no decreased vision, no photophobia and no cough.    Past Medical History:  Diagnosis Date  . Allergic rhinitis   . Asthma   . Asthmatic bronchitis   . History of URI (upper respiratory infection)   . Overactive bladder    Past Surgical History:  Procedure Laterality Date  . BREAST EXCISIONAL BIOPSY Left    benign     Current Meds  Medication Sig  . albuterol (VENTOLIN HFA) 108 (90 Base) MCG/ACT inhaler INHALE TWO PUFFS EVERY 4 HOURS AS NEEDED FOR WHEEZING  . BREO ELLIPTA 200-25 MCG/INH AEPB INHALE 1 PUFF BY MOUTH ONCE DAILY AT THE SAME TIME EACH DAY  . cetirizine (ZYRTEC) 10 MG tablet Take 10 mg by mouth daily.    Marland Kitchen ketotifen (ZADITOR) 0.025 %  ophthalmic solution 1 drop 2 (two) times daily.  . Multiple Vitamin (MULTIVITAMINS PO) Take 1 tablet by mouth once a day   . Vitamin D, Cholecalciferol, 50 MCG (2000 UT) CAPS Take by mouth.  . [DISCONTINUED] eletriptan (RELPAX) 40 MG tablet One tablet by mouth as needed for migraine headache.  If the headache improves and then returns, dose may be repeated after 2 hours have elapsed since first dose (do not exceed 80 mg per day). may repeat in 2 hours if necessary   . [DISCONTINUED] montelukast (SINGULAIR) 10 MG tablet TAKE 1 TABLET BY MOUTH ONCE DAILY     Allergies:   Cephalosporins; Penicillins; and Sulfa drugs cross reactors   Social History   Tobacco Use  . Smoking status: Never Smoker  . Smokeless tobacco: Never Used  Substance Use Topics  . Alcohol use: No  . Drug use: No     Family Hx: The patient's family history includes Coronary artery disease in her father; Diabetes in her father; Hypertension in her father and mother.  ROS:   Please see the history of present illness.    Review of Systems  Constitutional: Negative.  Negative for fever.  Eyes: Positive for redness and itching. Negative for photophobia.  Respiratory: Negative.  Negative for cough.   Cardiovascular: Negative.   Gastrointestinal: Negative.   Neurological: Negative.   Psychiatric/Behavioral: Negative.     All other systems reviewed and are negative.  Labs/Other Tests and Data Reviewed:    Recent Labs: No results found for requested labs within last 8760 hours.   Recent Lipid Panel No results found for: CHOL, TRIG, HDL, CHOLHDL, LDLCALC, LDLDIRECT  Wt Readings from Last 3 Encounters:  10/20/18 177 lb (80.3 kg)  07/22/18 183 lb 3.2 oz (83.1 kg)  11/15/12 186 lb (84.4 kg)     Exam:    Vital Signs:  BP 124/70 (BP Location: Right Leg, Patient Position: Sitting, Cuff Size: Normal) Comment: pt provided  Pulse 82   Temp 98.7 F (37.1 C) (Oral)   Ht 4\' 11"  (1.499 m)   Wt 177 lb (80.3 kg)   LMP  10/07/2018   BMI 35.75 kg/m     Physical Exam  Constitutional: She is well-developed, well-nourished, and in no distress.  HENT:  Head: Normocephalic and atraumatic.  Eyes: EOM are normal.  Slight erythematous sclerae on R  Pulmonary/Chest: Effort normal.  Neurological: She is alert.  Psychiatric: Affect normal.  Nursing note and vitals reviewed.   ASSESSMENT & PLAN:    1.  1. Allergic conjunctivitis of both eyes  She is encouraged to use Zaditor OTC gtt prn. She was also given rx ocuflox should her should progress to bacterial conjunctivitis. We did review the symptoms of this dx.   2. Seasonal allergic rhinitis due to pollen  Chronic. She will continue with current meds. She will add nasal spray should her symptoms persist/worsen.   3. Mild intermittent asthma without complication  Chronic, yet stable. She will continue with current meds.   4. Class 2 severe obesity due to excess calories without serious comorbidity and body mass index (BMI) of 35.0 to 35.9 in adult [facility](HCC)  Importance of achieving optimal weight to decrease risk of cardiovascular disease and cancers was discussed with the patient in full detail. She is encouraged to start slowly - start with 10 minutes twice daily at least three to four days per week and to gradually build to 30 minutes five days weekly. She was given tips to incorporate more activity into her daily routine - take stairs when possible, park farther away from her job, grocery stores, etc.       COVID-19 Education: The signs and symptoms of COVID-19 were discussed with the patient and how to seek care for testing (follow up with PCP or arrange E-visit).  The importance of social distancing was discussed today.  Patient Risk:   After full review of this patients clinical status, I feel that they are at least moderate risk at this time.  Time:   Today, I have spent 10 minutes 30 seconds with the patient with telehealth technology discussing  above diagnoses.     Medication Adjustments/Labs and Tests Ordered: Current medicines are reviewed at length with the patient today.  Concerns regarding medicines are outlined above.  Tests Ordered: No orders of the defined types were placed in this encounter.  Medication Changes: Meds ordered this encounter  Medications  . ofloxacin (OCUFLOX) 0.3 % ophthalmic solution    Sig: Place 1 drop into both eyes 4 (four) times daily.    Dispense:  10 mL    Refill:  0    Disposition:  Follow up prn  Signed, Gwynneth Alimentobyn N Lyndon Chenoweth, MD

## 2018-12-02 ENCOUNTER — Other Ambulatory Visit: Payer: Self-pay | Admitting: Internal Medicine

## 2018-12-19 ENCOUNTER — Other Ambulatory Visit: Payer: Self-pay | Admitting: Internal Medicine

## 2019-01-06 ENCOUNTER — Other Ambulatory Visit: Payer: Self-pay | Admitting: Internal Medicine

## 2019-01-17 ENCOUNTER — Other Ambulatory Visit: Payer: Self-pay | Admitting: *Deleted

## 2019-01-17 DIAGNOSIS — Z20822 Contact with and (suspected) exposure to covid-19: Secondary | ICD-10-CM

## 2019-01-23 LAB — NOVEL CORONAVIRUS, NAA: SARS-CoV-2, NAA: NOT DETECTED

## 2019-01-30 ENCOUNTER — Ambulatory Visit: Payer: BC Managed Care – PPO | Admitting: Internal Medicine

## 2019-01-30 ENCOUNTER — Other Ambulatory Visit: Payer: Self-pay

## 2019-01-30 ENCOUNTER — Encounter: Payer: Self-pay | Admitting: Internal Medicine

## 2019-01-30 VITALS — BP 122/84 | HR 61 | Temp 98.0°F | Ht 60.8 in | Wt 184.6 lb

## 2019-01-30 DIAGNOSIS — Z0000 Encounter for general adult medical examination without abnormal findings: Secondary | ICD-10-CM

## 2019-01-30 DIAGNOSIS — N943 Premenstrual tension syndrome: Secondary | ICD-10-CM

## 2019-01-30 DIAGNOSIS — J4520 Mild intermittent asthma, uncomplicated: Secondary | ICD-10-CM

## 2019-01-30 LAB — POCT URINALYSIS DIPSTICK
Bilirubin, UA: NEGATIVE
Blood, UA: NEGATIVE
Glucose, UA: NEGATIVE
Ketones, UA: NEGATIVE
Leukocytes, UA: NEGATIVE
Nitrite, UA: NEGATIVE
Protein, UA: NEGATIVE
Spec Grav, UA: 1.025 (ref 1.010–1.025)
Urobilinogen, UA: 0.2 E.U./dL
pH, UA: 5.5 (ref 5.0–8.0)

## 2019-01-30 MED ORDER — MONTELUKAST SODIUM 10 MG PO TABS: 10.0000 mg | ORAL_TABLET | Freq: Every day | ORAL | 2 refills | Status: DC

## 2019-01-30 MED ORDER — ALBUTEROL SULFATE HFA 108 (90 BASE) MCG/ACT IN AERS: 2.0000 | INHALATION_SPRAY | Freq: Four times a day (QID) | RESPIRATORY_TRACT | 5 refills | Status: DC | PRN

## 2019-01-30 MED ORDER — BREO ELLIPTA 200-25 MCG/INH IN AEPB: 1.0000 | INHALATION_SPRAY | Freq: Every day | RESPIRATORY_TRACT | 11 refills | Status: DC

## 2019-01-30 MED ORDER — ALBUTEROL SULFATE HFA 108 (90 BASE) MCG/ACT IN AERS: 2.0000 | INHALATION_SPRAY | Freq: Four times a day (QID) | RESPIRATORY_TRACT | 11 refills | Status: DC | PRN

## 2019-01-30 NOTE — Progress Notes (Signed)
Subjective:     Patient ID: Lauren Carroll , female    DOB: 1970-08-16 , 48 y.o.   MRN: [id number]   Chief Complaint  Patient presents with  . Annual Exam    HPI  She is here today for a full physical examination. She is followed by Dr. Cherly Hensenousins for her pelvic examinations. She reports her last exam was in Feb 2020.     Past Medical History:  Diagnosis Date  . Allergic rhinitis   . Asthma   . Asthmatic bronchitis   . History of URI (upper respiratory infection)   . Overactive bladder      Family History  Problem Relation Age of Onset  . Hypertension Mother   . Diabetes Father   . Hypertension Father   . Coronary artery disease Father      Current Outpatient Medications:  .  albuterol (VENTOLIN HFA) 108 (90 Base) MCG/ACT inhaler, Inhale 2 puffs into the lungs every 6 (six) hours as needed for wheezing or shortness of breath., Disp: 18 g, Rfl: 5 .  cetirizine (ZYRTEC) 10 MG tablet, Take 10 mg by mouth daily.  , Disp: , Rfl:  .  fluticasone furoate-vilanterol (BREO ELLIPTA) 200-25 MCG/INH AEPB, Inhale 1 puff into the lungs daily., Disp: 60 each, Rfl: 11 .  ketotifen (ZADITOR) 0.025 % ophthalmic solution, 1 drop 2 (two) times daily., Disp: , Rfl:  .  montelukast (SINGULAIR) 10 MG tablet, Take 1 tablet (10 mg total) by mouth daily., Disp: 90 tablet, Rfl: 2 .  Multiple Vitamin (MULTIVITAMINS PO), Take 1 tablet by mouth once a day , Disp: , Rfl:  .  Vitamin D, Cholecalciferol, 50 MCG (2000 UT) CAPS, Take by mouth., Disp: , Rfl:    Allergies  Allergen Reactions  . Cephalosporins   . Penicillins   . Sulfa Drugs Cross Reactors     itching     The patient states she uses none for birth control. Last LMP was Patient's last menstrual period was 01/20/2019.. Negative for Dysmenorrhea Negative for: breast discharge, breast lump(s), breast pain and breast self exam. Associated symptoms include abnormal vaginal bleeding. Pertinent negatives include abnormal bleeding (hematology),  anxiety, decreased libido, depression, difficulty falling sleep, dyspareunia, history of infertility, nocturia, sexual dysfunction, sleep disturbances, urinary incontinence, urinary urgency, vaginal discharge and vaginal itching. Diet regular.The patient states her exercise level is  moderate -she runs 6-7 days per week.   . The patient's tobacco use is:  Social History   Tobacco Use  Smoking Status Never Smoker  Smokeless Tobacco Never Used  . She has been exposed to passive smoke. The patient's alcohol use is:  Social History   Substance and Sexual Activity  Alcohol Use No    Review of Systems  Constitutional: Negative.   HENT: Negative.   Eyes: Negative.   Respiratory: Positive for chest tightness.        She reports she has been feeling more chest tightness recently. Sx started after pharmacy changed her inhaler from Pro-air to Ventolin. She does not feel the Ventolin is as effective. She uses prior to running in am; however, admits that her sx recur about mid-day. Wants new rx for Pro-air to be sent to the pharmacy.   Cardiovascular: Negative.   Endocrine: Negative.   Genitourinary: Negative.        She reports she was recently diagnosed with PMS as per Dr. Cherly Hensenousins. She is now taking "Sunny Mood" - she feels it is effective  Musculoskeletal: Negative.  Skin: Negative.   Allergic/Immunologic: Negative.   Neurological: Negative.   Hematological: Negative.   Psychiatric/Behavioral: Negative.      Today's Vitals   01/30/19 0935  BP: 122/84  Pulse: 61  Temp: 98 F (36.7 C)  TempSrc: Oral  Weight: 184 lb 9.6 oz (83.7 kg)  Height: 5' 0.8" (1.544 m)   Body mass index is 35.11 kg/m.   Objective:  Physical Exam Vitals signs and nursing note reviewed.  Constitutional:      Appearance: Normal appearance.  HENT:     Head: Normocephalic and atraumatic.     Right Ear: Tympanic membrane, ear canal and external ear normal.     Left Ear: Tympanic membrane, ear canal and  external ear normal.     Nose: Nose normal.     Mouth/Throat:     Mouth: Mucous membranes are moist.     Pharynx: Oropharynx is clear.  Eyes:     Extraocular Movements: Extraocular movements intact.     Conjunctiva/sclera: Conjunctivae normal.     Pupils: Pupils are equal, round, and reactive to light.  Neck:     Musculoskeletal: Normal range of motion and neck supple.  Cardiovascular:     Rate and Rhythm: Normal rate and regular rhythm.     Pulses: Normal pulses.     Heart sounds: Normal heart sounds.  Pulmonary:     Effort: Pulmonary effort is normal.     Breath sounds: Normal breath sounds.  Chest:     Breasts: Tanner Score is 5.        Right: Normal. No swelling, bleeding, inverted nipple, mass, nipple discharge or skin change.        Left: Normal. No swelling, bleeding, inverted nipple, mass, nipple discharge or skin change.  Abdominal:     General: Abdomen is flat. Bowel sounds are normal.     Palpations: Abdomen is soft.  Genitourinary:    Comments: deferred Musculoskeletal: Normal range of motion.  Skin:    General: Skin is warm and dry.  Neurological:     General: No focal deficit present.     Mental Status: She is alert and oriented to person, place, and time.  Psychiatric:        Mood and Affect: Mood normal.        Behavior: Behavior normal.         Assessment And Plan:     1. Routine general medical examination at health care facility  A full exam was performed.  Importance of monthly self breast exams was discussed with the patient. PATIENT HAS BEEN ADVISED TO GET 30-45 MINUTES REGULAR EXERCISE NO LESS THAN FOUR TO FIVE DAYS PER WEEK - BOTH WEIGHTBEARING EXERCISES AND AEROBIC ARE RECOMMENDED.  SHE IS ADVISED TO FOLLOW A HEALTHY DIET WITH AT LEAST SIX FRUITS/VEGGIES PER DAY, DECREASE INTAKE OF RED MEAT, AND TO INCREASE FISH INTAKE TO TWO DAYS PER WEEK.  MEATS/FISH SHOULD NOT BE FRIED, BAKED OR BROILED IS PREFERABLE.  I SUGGEST WEARING SPF 50 SUNSCREEN ON  EXPOSED PARTS AND ESPECIALLY WHEN IN THE DIRECT SUNLIGHT FOR AN EXTENDED PERIOD OF TIME.  PLEASE AVOID FAST FOOD RESTAURANTS AND INCREASE YOUR WATER INTAKE.   2. Mild intermittent asthma without complication  Chronic. I will change her to Proair MDI as requested. I will also give her a nebulizer. She is aware of how to use it properly. She will let me know if her sx persist. If so, she agrees to see Pulmonary for PFTs. All questions were  answered to her satisfaction and she is in agreement with her treatment plan.   3. PMS (perimenopause)  Maximino Greenland, MD    THE PATIENT IS ENCOURAGED TO PRACTICE SOCIAL DISTANCING DUE TO THE COVID-19 PANDEMIC.

## 2019-01-30 NOTE — Patient Instructions (Signed)
Health Maintenance, Female Adopting a healthy lifestyle and getting preventive care are important in promoting health and wellness. Ask your health care provider about:  The right schedule for you to have regular tests and exams.  Things you can do on your own to prevent diseases and keep yourself healthy. What should I know about diet, weight, and exercise? Eat a healthy diet   Eat a diet that includes plenty of vegetables, fruits, low-fat dairy products, and lean protein.  Do not eat a lot of foods that are high in solid fats, added sugars, or sodium. Maintain a healthy weight Body mass index (BMI) is used to identify weight problems. It estimates body fat based on height and weight. Your health care provider can help determine your BMI and help you achieve or maintain a healthy weight. Get regular exercise Get regular exercise. This is one of the most important things you can do for your health. Most adults should:  Exercise for at least 150 minutes each week. The exercise should increase your heart rate and make you sweat (moderate-intensity exercise).  Do strengthening exercises at least twice a week. This is in addition to the moderate-intensity exercise.  Spend less time sitting. Even light physical activity can be beneficial. Watch cholesterol and blood lipids Have your blood tested for lipids and cholesterol at 50 years of age, then have this test every 5 years. Have your cholesterol levels checked more often if:  Your lipid or cholesterol levels are high.  You are older than 50 years of age.  You are at high risk for heart disease. What should I know about cancer screening? Depending on your health history and family history, you may need to have cancer screening at various ages. This may include screening for:  Breast cancer.  Cervical cancer.  Colorectal cancer.  Skin cancer.  Lung cancer. What should I know about heart disease, diabetes, and high blood  pressure? Blood pressure and heart disease  High blood pressure causes heart disease and increases the risk of stroke. This is more likely to develop in people who have high blood pressure readings, are of African descent, or are overweight.  Have your blood pressure checked: ? Every 3-5 years if you are 18-39 years of age. ? Every year if you are 40 years old or older. Diabetes Have regular diabetes screenings. This checks your fasting blood sugar level. Have the screening done:  Once every three years after age 40 if you are at a normal weight and have a low risk for diabetes.  More often and at a younger age if you are overweight or have a high risk for diabetes. What should I know about preventing infection? Hepatitis B If you have a higher risk for hepatitis B, you should be screened for this virus. Talk with your health care provider to find out if you are at risk for hepatitis B infection. Hepatitis C Testing is recommended for:  Everyone born from 1945 through 1965.  Anyone with known risk factors for hepatitis C. Sexually transmitted infections (STIs)  Get screened for STIs, including gonorrhea and chlamydia, if: ? You are sexually active and are younger than 50 years of age. ? You are older than 50 years of age and your health care provider tells you that you are at risk for this type of infection. ? Your sexual activity has changed since you were last screened, and you are at increased risk for chlamydia or gonorrhea. Ask your health care provider if   you are at risk.  Ask your health care provider about whether you are at high risk for HIV. Your health care provider may recommend a prescription medicine to help prevent HIV infection. If you choose to take medicine to prevent HIV, you should first get tested for HIV. You should then be tested every 3 months for as long as you are taking the medicine. Pregnancy  If you are about to stop having your period (premenopausal) and  you may become pregnant, seek counseling before you get pregnant.  Take 400 to 800 micrograms (mcg) of folic acid every day if you become pregnant.  Ask for birth control (contraception) if you want to prevent pregnancy. Osteoporosis and menopause Osteoporosis is a disease in which the bones lose minerals and strength with aging. This can result in bone fractures. If you are 65 years old or older, or if you are at risk for osteoporosis and fractures, ask your health care provider if you should:  Be screened for bone loss.  Take a calcium or vitamin D supplement to lower your risk of fractures.  Be given hormone replacement therapy (HRT) to treat symptoms of menopause. Follow these instructions at home: Lifestyle  Do not use any products that contain nicotine or tobacco, such as cigarettes, e-cigarettes, and chewing tobacco. If you need help quitting, ask your health care provider.  Do not use street drugs.  Do not share needles.  Ask your health care provider for help if you need support or information about quitting drugs. Alcohol use  Do not drink alcohol if: ? Your health care provider tells you not to drink. ? You are pregnant, may be pregnant, or are planning to become pregnant.  If you drink alcohol: ? Limit how much you use to 0-1 drink a day. ? Limit intake if you are breastfeeding.  Be aware of how much alcohol is in your drink. In the [place] one drink equals one 12 oz bottle of beer (355 mL), one 5 oz glass of wine (148 mL), or one 1 oz glass of hard liquor (44 mL). General instructions  Schedule regular health, dental, and eye exams.  Stay current with your vaccines.  Tell your health care provider if: ? You often feel depressed. ? You have ever been abused or do not feel safe at home. Summary  Adopting a healthy lifestyle and getting preventive care are important in promoting health and wellness.  Follow your health care provider's instructions about healthy  diet, exercising, and getting tested or screened for diseases.  Follow your health care provider's instructions on monitoring your cholesterol and blood pressure. This information is not intended to replace advice given to you by your health care provider. Make sure you discuss any questions you have with your health care provider. Document Released: 01/12/2011 Document Revised: 06/22/2018 Document Reviewed: 06/22/2018 Elsevier Patient Education  2020 [employer]  

## 2019-01-31 LAB — CBC
Hematocrit: 39.6 % (ref 34.0–46.6)
Hemoglobin: 12.8 g/dL (ref 11.1–15.9)
MCH: 30.4 pg (ref 26.6–33.0)
MCHC: 32.3 g/dL (ref 31.5–35.7)
MCV: 94 fL (ref 79–97)
Platelets: 299 10*3/uL (ref 150–450)
RBC: 4.21 x10E6/uL (ref 3.77–5.28)
RDW: 11.7 % (ref 11.7–15.4)
WBC: 7.1 10*3/uL (ref 3.4–10.8)

## 2019-01-31 LAB — CMP14+EGFR
ALT: 22 IU/L (ref 0–32)
AST: 20 IU/L (ref 0–40)
Albumin/Globulin Ratio: 1.6 (ref 1.2–2.2)
Albumin: 4.2 g/dL (ref 3.8–4.8)
Alkaline Phosphatase: 65 IU/L (ref 39–117)
BUN/Creatinine Ratio: 15 (ref 9–23)
BUN: 15 mg/dL (ref 6–24)
Bilirubin Total: 0.8 mg/dL (ref 0.0–1.2)
CO2: 21 mmol/L (ref 20–29)
Calcium: 9.4 mg/dL (ref 8.7–10.2)
Chloride: 104 mmol/L (ref 96–106)
Creatinine, Ser: 0.97 mg/dL (ref 0.57–1.00)
GFR calc Af Amer: 79 mL/min/{1.73_m2} (ref >59)
GFR calc non Af Amer: 69 mL/min/{1.73_m2} (ref >59)
Globulin, Total: 2.7 g/dL (ref 1.5–4.5)
Glucose: 79 mg/dL (ref 65–99)
Potassium: 4.2 mmol/L (ref 3.5–5.2)
Sodium: 142 mmol/L (ref 134–144)
Total Protein: 6.9 g/dL (ref 6.0–8.5)

## 2019-01-31 LAB — LIPID PANEL
Chol/HDL Ratio: 2.4 ratio (ref 0.0–4.4)
Cholesterol, Total: 181 mg/dL (ref 100–199)
HDL: 74 mg/dL (ref >39)
LDL Calculated: 93 mg/dL (ref 0–99)
Triglycerides: 69 mg/dL (ref 0–149)
VLDL Cholesterol Cal: 14 mg/dL (ref 5–40)

## 2019-01-31 LAB — HEMOGLOBIN A1C
Est. average glucose Bld gHb Est-mCnc: 114 mg/dL
Hgb A1c MFr Bld: 5.6 % (ref 4.8–5.6)

## 2019-03-28 ENCOUNTER — Encounter: Payer: Self-pay | Admitting: Internal Medicine

## 2019-03-28 LAB — HM COLONOSCOPY

## 2019-03-29 ENCOUNTER — Encounter: Payer: Self-pay | Admitting: Internal Medicine

## 2019-07-12 ENCOUNTER — Other Ambulatory Visit: Payer: Self-pay | Admitting: Obstetrics and Gynecology

## 2019-07-12 DIAGNOSIS — Z1231 Encounter for screening mammogram for malignant neoplasm of breast: Secondary | ICD-10-CM

## 2019-07-17 ENCOUNTER — Ambulatory Visit: Payer: BC Managed Care – PPO | Attending: Internal Medicine

## 2019-07-17 DIAGNOSIS — Z20822 Contact with and (suspected) exposure to covid-19: Secondary | ICD-10-CM

## 2019-07-18 LAB — NOVEL CORONAVIRUS, NAA: SARS-CoV-2, NAA: NOT DETECTED

## 2019-08-31 ENCOUNTER — Ambulatory Visit: Payer: BC Managed Care – PPO

## 2019-09-07 ENCOUNTER — Ambulatory Visit: Payer: BC Managed Care – PPO

## 2019-10-04 ENCOUNTER — Ambulatory Visit: Payer: BC Managed Care – PPO | Attending: Internal Medicine

## 2019-10-12 ENCOUNTER — Other Ambulatory Visit: Payer: Self-pay

## 2019-10-12 ENCOUNTER — Ambulatory Visit
Admission: RE | Admit: 2019-10-12 | Discharge: 2019-10-12 | Disposition: A | Discharge status: Home / Self Care | Payer: BC Managed Care – PPO | Source: Ambulatory Visit | Attending: Obstetrics and Gynecology | Admitting: Obstetrics and Gynecology

## 2019-10-12 DIAGNOSIS — Z1231 Encounter for screening mammogram for malignant neoplasm of breast: Secondary | ICD-10-CM

## 2019-11-14 ENCOUNTER — Other Ambulatory Visit: Payer: Self-pay | Admitting: Internal Medicine

## 2020-01-12 ENCOUNTER — Other Ambulatory Visit: Payer: Self-pay | Admitting: Internal Medicine

## 2020-01-12 MED ORDER — BREO ELLIPTA 200-25 MCG/INH IN AEPB: 1.0000 | INHALATION_SPRAY | Freq: Every day | RESPIRATORY_TRACT | 11 refills | Status: DC

## 2020-01-31 ENCOUNTER — Encounter: Payer: BC Managed Care – PPO | Admitting: Internal Medicine

## 2020-02-06 ENCOUNTER — Ambulatory Visit (INDEPENDENT_AMBULATORY_CARE_PROVIDER_SITE_OTHER): Payer: BC Managed Care – PPO | Admitting: Internal Medicine

## 2020-02-06 ENCOUNTER — Other Ambulatory Visit: Payer: Self-pay

## 2020-02-06 ENCOUNTER — Encounter: Payer: Self-pay | Admitting: Internal Medicine

## 2020-02-06 VITALS — BP 120/70 | HR 68 | Temp 97.8°F | Ht 60.8 in | Wt 167.2 lb

## 2020-02-06 DIAGNOSIS — Z1159 Encounter for screening for other viral diseases: Secondary | ICD-10-CM

## 2020-02-06 DIAGNOSIS — E559 Vitamin D deficiency, unspecified: Secondary | ICD-10-CM | POA: Diagnosis not present

## 2020-02-06 DIAGNOSIS — Z0000 Encounter for general adult medical examination without abnormal findings: Secondary | ICD-10-CM | POA: Diagnosis not present

## 2020-02-06 DIAGNOSIS — Z6835 Body mass index (BMI) 35.0-35.9, adult: Secondary | ICD-10-CM

## 2020-02-06 MED ORDER — ALBUTEROL SULFATE HFA 108 (90 BASE) MCG/ACT IN AERS: INHALATION_SPRAY | RESPIRATORY_TRACT | 11 refills | Status: DC

## 2020-02-06 MED ORDER — MONTELUKAST SODIUM 10 MG PO TABS: 10.0000 mg | ORAL_TABLET | Freq: Every day | ORAL | 2 refills | Status: DC

## 2020-02-06 NOTE — Patient Instructions (Signed)
Health Maintenance, Female Adopting a healthy lifestyle and getting preventive care are important in promoting health and wellness. Ask your health care provider about:  The right schedule for you to have regular tests and exams.  Things you can do on your own to prevent diseases and keep yourself healthy. What should I know about diet, weight, and exercise? Eat a healthy diet   Eat a diet that includes plenty of vegetables, fruits, low-fat dairy products, and lean protein.  Do not eat a lot of foods that are high in solid fats, added sugars, or sodium. Maintain a healthy weight Body mass index (BMI) is used to identify weight problems. It estimates body fat based on height and weight. Your health care provider can help determine your BMI and help you achieve or maintain a healthy weight. Get regular exercise Get regular exercise. This is one of the most important things you can do for your health. Most adults should:  Exercise for at least 150 minutes each week. The exercise should increase your heart rate and make you sweat (moderate-intensity exercise).  Do strengthening exercises at least twice a week. This is in addition to the moderate-intensity exercise.  Spend less time sitting. Even light physical activity can be beneficial. Watch cholesterol and blood lipids Have your blood tested for lipids and cholesterol at 51 years of age, then have this test every 5 years. Have your cholesterol levels checked more often if:  Your lipid or cholesterol levels are high.  You are older than 51 years of age.  You are at high risk for heart disease. What should I know about cancer screening? Depending on your health history and family history, you may need to have cancer screening at various ages. This may include screening for:  Breast cancer.  Cervical cancer.  Colorectal cancer.  Skin cancer.  Lung cancer. What should I know about heart disease, diabetes, and high blood  pressure? Blood pressure and heart disease  High blood pressure causes heart disease and increases the risk of stroke. This is more likely to develop in people who have high blood pressure readings, are of African descent, or are overweight.  Have your blood pressure checked: ? Every 3-5 years if you are 18-39 years of age. ? Every year if you are 40 years old or older. Diabetes Have regular diabetes screenings. This checks your fasting blood sugar level. Have the screening done:  Once every three years after age 40 if you are at a normal weight and have a low risk for diabetes.  More often and at a younger age if you are overweight or have a high risk for diabetes. What should I know about preventing infection? Hepatitis B If you have a higher risk for hepatitis B, you should be screened for this virus. Talk with your health care provider to find out if you are at risk for hepatitis B infection. Hepatitis C Testing is recommended for:  Everyone born from 1945 through 1965.  Anyone with known risk factors for hepatitis C. Sexually transmitted infections (STIs)  Get screened for STIs, including gonorrhea and chlamydia, if: ? You are sexually active and are younger than 51 years of age. ? You are older than 51 years of age and your health care provider tells you that you are at risk for this type of infection. ? Your sexual activity has changed since you were last screened, and you are at increased risk for chlamydia or gonorrhea. Ask your health care provider if   you are at risk.  Ask your health care provider about whether you are at high risk for HIV. Your health care provider may recommend a prescription medicine to help prevent HIV infection. If you choose to take medicine to prevent HIV, you should first get tested for HIV. You should then be tested every 3 months for as long as you are taking the medicine. Pregnancy  If you are about to stop having your period (premenopausal) and  you may become pregnant, seek counseling before you get pregnant.  Take 400 to 800 micrograms (mcg) of folic acid every day if you become pregnant.  Ask for birth control (contraception) if you want to prevent pregnancy. Osteoporosis and menopause Osteoporosis is a disease in which the bones lose minerals and strength with aging. This can result in bone fractures. If you are 65 years old or older, or if you are at risk for osteoporosis and fractures, ask your health care provider if you should:  Be screened for bone loss.  Take a calcium or vitamin D supplement to lower your risk of fractures.  Be given hormone replacement therapy (HRT) to treat symptoms of menopause. Follow these instructions at home: Lifestyle  Do not use any products that contain nicotine or tobacco, such as cigarettes, e-cigarettes, and chewing tobacco. If you need help quitting, ask your health care provider.  Do not use street drugs.  Do not share needles.  Ask your health care provider for help if you need support or information about quitting drugs. Alcohol use  Do not drink alcohol if: ? Your health care provider tells you not to drink. ? You are pregnant, may be pregnant, or are planning to become pregnant.  If you drink alcohol: ? Limit how much you use to 0-1 drink a day. ? Limit intake if you are breastfeeding.  Be aware of how much alcohol is in your drink. In the [place] one drink equals one 12 oz bottle of beer (355 mL), one 5 oz glass of wine (148 mL), or one 1 oz glass of hard liquor (44 mL). General instructions  Schedule regular health, dental, and eye exams.  Stay current with your vaccines.  Tell your health care provider if: ? You often feel depressed. ? You have ever been abused or do not feel safe at home. Summary  Adopting a healthy lifestyle and getting preventive care are important in promoting health and wellness.  Follow your health care provider's instructions about healthy  diet, exercising, and getting tested or screened for diseases.  Follow your health care provider's instructions on monitoring your cholesterol and blood pressure. This information is not intended to replace advice given to you by your health care provider. Make sure you discuss any questions you have with your health care provider. Document Revised: 06/22/2018 Document Reviewed: 06/22/2018 Elsevier Patient Education  2020 [employer] Inc.  

## 2020-02-06 NOTE — Progress Notes (Signed)
I,Tianna Badgett,acting as a Education administrator for Maximino Greenland, MD.,have documented all relevant documentation on the behalf of Maximino Greenland, MD,as directed by  Maximino Greenland, MD while in the presence of Maximino Greenland, MD.  This visit occurred during the SARS-CoV-2 public health emergency.  Safety protocols were in place, including screening questions prior to the visit, additional usage of staff PPE, and extensive cleaning of exam room while observing appropriate contact time as indicated for disinfecting solutions.  Subjective:     Patient ID: Lauren Carroll , female    DOB: May 31, 1971 , 49 y.o.   MRN: [id number]   Chief Complaint  Patient presents with  . Annual Exam    HPI  She is here today for a full physical examination. She is followed by Dr. Garwin Brothers for her pelvic examinations. She reports her last exam was in March 2020. Pt has requested an EKG.     Past Medical History:  Diagnosis Date  . Allergic rhinitis   . Asthma   . Asthmatic bronchitis   . History of URI (upper respiratory infection)   . Overactive bladder      Family History  Problem Relation Age of Onset  . Hypertension Mother   . Diabetes Father   . Hypertension Father   . Coronary artery disease Father      Current Outpatient Medications:  .  albuterol (VENTOLIN HFA) 108 (90 Base) MCG/ACT inhaler, INHALE 2 PUFFS BY MOUTH EVERY 6 HOURS AS NEEDED FOR WHEEZING FOR SHORTNESS OF BREATH, Disp: 18 g, Rfl: 11 .  BREO ELLIPTA 200-25 MCG/INH AEPB, Inhale 1 puff by mouth once daily, Disp: 60 each, Rfl: 0 .  cetirizine (ZYRTEC) 10 MG tablet, Take 10 mg by mouth daily.  , Disp: , Rfl:  .  fluticasone furoate-vilanterol (BREO ELLIPTA) 200-25 MCG/INH AEPB, Inhale 1 puff into the lungs daily., Disp: 60 each, Rfl: 11 .  montelukast (SINGULAIR) 10 MG tablet, Take 1 tablet (10 mg total) by mouth daily., Disp: 90 tablet, Rfl: 2 .  Multiple Vitamin (MULTIVITAMINS PO), Take 1 tablet by mouth once a day , Disp: , Rfl:  .   Vitamin D, Cholecalciferol, 50 MCG (2000 UT) CAPS, Take by mouth., Disp: , Rfl:    Allergies  Allergen Reactions  . Cephalosporins   . Penicillins   . Sulfa Drugs Cross Reactors     itching      The patient states she uses none for birth control. Last LMP was Patient's last menstrual period was 02/04/2020.. Negative for Dysmenorrhea. Negative for: breast discharge, breast lump(s), breast pain and breast self exam. Associated symptoms include abnormal vaginal bleeding. Pertinent negatives include abnormal bleeding (hematology), anxiety, decreased libido, depression, difficulty falling sleep, dyspareunia, history of infertility, nocturia, sexual dysfunction, sleep disturbances, urinary incontinence, urinary urgency, vaginal discharge and vaginal itching. Diet regular.The patient states her exercise level is  moderate.  . The patient's tobacco use is:  Social History   Tobacco Use  Smoking Status Never Smoker  Smokeless Tobacco Never Used  . She has been exposed to passive smoke. The patient's alcohol use is:  Social History   Substance and Sexual Activity  Alcohol Use No    Review of Systems  Constitutional: Negative.   HENT: Negative.   Eyes: Negative.   Respiratory: Negative.   Cardiovascular: Negative.   Gastrointestinal: Negative.   Endocrine: Negative.   Genitourinary: Negative.   Musculoskeletal: Negative.   Skin: Negative.   Allergic/Immunologic: Negative.   Neurological: Negative.  Hematological: Negative.   Psychiatric/Behavioral: Negative.      Today's Vitals   02/06/20 1423  BP: 120/70  Pulse: 68  Temp: 97.8 F (36.6 C)  TempSrc: Oral  Weight: 167 lb 3.2 oz (75.8 kg)  Height: 5' 0.8" (1.544 m)   Body mass index is 31.8 kg/m.   Wt Readings from Last 3 Encounters:  02/06/20 167 lb 3.2 oz (75.8 kg)  01/30/19 184 lb 9.6 oz (83.7 kg)  10/20/18 177 lb (80.3 kg)   Objective:  Physical Exam Constitutional:      General: She is not in acute distress.     Appearance: Normal appearance. She is well-developed.  HENT:     Head: Normocephalic and atraumatic.     Right Ear: Hearing, tympanic membrane, ear canal and external ear normal. There is no impacted cerumen.     Left Ear: Hearing, tympanic membrane, ear canal and external ear normal. There is no impacted cerumen.     Nose:     Comments: Deferred, masked    Mouth/Throat:     Comments: Deferred, masked Eyes:     General: Lids are normal.     Extraocular Movements: Extraocular movements intact.     Conjunctiva/sclera: Conjunctivae normal.     Pupils: Pupils are equal, round, and reactive to light.     Funduscopic exam:    Right eye: No papilledema.        Left eye: No papilledema.  Neck:     Thyroid: No thyroid mass.     Vascular: No carotid bruit.  Cardiovascular:     Rate and Rhythm: Normal rate and regular rhythm.     Pulses: Normal pulses.     Heart sounds: Normal heart sounds. No murmur heard.   Pulmonary:     Effort: Pulmonary effort is normal.     Breath sounds: Normal breath sounds.  Chest:     Breasts: Tanner Score is 5.        Right: Normal.        Left: Normal.  Abdominal:     General: Abdomen is flat. Bowel sounds are normal. There is no distension.     Palpations: Abdomen is soft.     Tenderness: There is no abdominal tenderness.  Musculoskeletal:        General: No swelling. Normal range of motion.     Cervical back: Full passive range of motion without pain, normal range of motion and neck supple.     Right lower leg: No edema.     Left lower leg: No edema.  Skin:    General: Skin is warm and dry.     Capillary Refill: Capillary refill takes less than 2 seconds.  Neurological:     General: No focal deficit present.     Mental Status: She is alert and oriented to person, place, and time.     Cranial Nerves: No cranial nerve deficit.     Sensory: No sensory deficit.  Psychiatric:        Mood and Affect: Mood normal.        Behavior: Behavior normal.         Thought Content: Thought content normal.        Judgment: Judgment normal.         Assessment And Plan:     1. Routine general medical examination at health care facility Comments: A full exam was performed.  Importance of monthly self breast exams was discussed with the patient. EKG performed, per  request - NSR w/o acute changes.  PATIENT IS ADVISED TO GET 30-45 MINUTES REGULAR EXERCISE NO LESS THAN FOUR TO FIVE DAYS PER WEEK - BOTH WEIGHTBEARING EXERCISES AND AEROBIC ARE RECOMMENDED.  PATIENT IS ADVISED TO FOLLOW A HEALTHY DIET WITH AT LEAST SIX FRUITS/VEGGIES PER DAY, DECREASE INTAKE OF RED MEAT, AND TO INCREASE FISH INTAKE TO TWO DAYS PER WEEK.  MEATS/FISH SHOULD NOT BE FRIED, BAKED OR BROILED IS PREFERABLE.  I SUGGEST WEARING SPF 50 SUNSCREEN ON EXPOSED PARTS AND ESPECIALLY WHEN IN THE DIRECT SUNLIGHT FOR AN EXTENDED PERIOD OF TIME.  PLEASE AVOID FAST FOOD RESTAURANTS AND INCREASE YOUR WATER INTAKE.  - Hemoglobin A1c - CBC - CMP14+EGFR - Lipid panel - HIV Antibody (routine testing w rflx) - EKG 12-Lead  2. Encounter for HCV screening test for low risk patient Comments: I will check HCV ab.  - Hepatitis C antibody  3. Vitamin D deficiency  I WILL CHECK A VIT D LEVEL AND SUPPLEMENT AS NEEDED.  ALSO ENCOURAGED TO SPEND 15 MINUTES IN THE SUN DAILY.  - VITAMIN D 25 Hydroxy (Vit-D Deficiency, Fractures)  4. Class 2 severe obesity due to excess calories with serious comorbidity and body mass index (BMI) of 35.0 to 35.9 in adult [facility])  She is encouraged to strive for BMI less than 28 to decrease cardiac risk. Advised to aim for at least 150 minutes of exercise per week.     Patient was given opportunity to ask questions. Patient verbalized understanding of the plan and was able to repeat key elements of the plan. All questions were answered to their satisfaction.   Maximino Greenland, MD   I, Maximino Greenland, MD, have reviewed all documentation for this visit. The documentation  on 02/11/20 for the exam, diagnosis, procedures, and orders are all accurate and complete.  THE PATIENT IS ENCOURAGED TO PRACTICE SOCIAL DISTANCING DUE TO THE COVID-19 PANDEMIC.

## 2020-02-07 LAB — CMP14+EGFR
ALT: 22 IU/L (ref 0–32)
AST: 22 IU/L (ref 0–40)
Albumin/Globulin Ratio: 1.4 (ref 1.2–2.2)
Albumin: 4.3 g/dL (ref 3.8–4.8)
Alkaline Phosphatase: 65 IU/L (ref 48–121)
BUN/Creatinine Ratio: 20 (ref 9–23)
BUN: 20 mg/dL (ref 6–24)
Bilirubin Total: 0.9 mg/dL (ref 0.0–1.2)
CO2: 22 mmol/L (ref 20–29)
Calcium: 9.4 mg/dL (ref 8.7–10.2)
Chloride: 100 mmol/L (ref 96–106)
Creatinine, Ser: 0.98 mg/dL (ref 0.57–1.00)
GFR calc Af Amer: 78 mL/min/{1.73_m2} (ref >59)
GFR calc non Af Amer: 67 mL/min/{1.73_m2} (ref >59)
Globulin, Total: 3.1 g/dL (ref 1.5–4.5)
Glucose: 82 mg/dL (ref 65–99)
Potassium: 4.1 mmol/L (ref 3.5–5.2)
Sodium: 139 mmol/L (ref 134–144)
Total Protein: 7.4 g/dL (ref 6.0–8.5)

## 2020-02-07 LAB — LIPID PANEL
Chol/HDL Ratio: 2.3 ratio (ref 0.0–4.4)
Cholesterol, Total: 200 mg/dL — ABNORMAL HIGH (ref 100–199)
HDL: 86 mg/dL (ref >39)
LDL Chol Calc (NIH): 102 mg/dL — ABNORMAL HIGH (ref 0–99)
Triglycerides: 69 mg/dL (ref 0–149)
VLDL Cholesterol Cal: 12 mg/dL (ref 5–40)

## 2020-02-07 LAB — CBC
Hematocrit: 39.8 % (ref 34.0–46.6)
Hemoglobin: 13.2 g/dL (ref 11.1–15.9)
MCH: 31.1 pg (ref 26.6–33.0)
MCHC: 33.2 g/dL (ref 31.5–35.7)
MCV: 94 fL (ref 79–97)
Platelets: 268 10*3/uL (ref 150–450)
RBC: 4.24 x10E6/uL (ref 3.77–5.28)
RDW: 11.8 % (ref 11.7–15.4)
WBC: 6.9 10*3/uL (ref 3.4–10.8)

## 2020-02-07 LAB — VITAMIN D 25 HYDROXY (VIT D DEFICIENCY, FRACTURES): Vit D, 25-Hydroxy: 56.3 ng/mL (ref 30.0–100.0)

## 2020-02-07 LAB — HIV ANTIBODY (ROUTINE TESTING W REFLEX): HIV Screen 4th Generation wRfx: NONREACTIVE

## 2020-02-07 LAB — HEMOGLOBIN A1C
Est. average glucose Bld gHb Est-mCnc: 117 mg/dL
Hgb A1c MFr Bld: 5.7 % — ABNORMAL HIGH (ref 4.8–5.6)

## 2020-02-07 LAB — HEPATITIS C ANTIBODY: Hep C Virus Ab: 0.1 s/co ratio (ref 0.0–0.9)

## 2020-03-26 ENCOUNTER — Telehealth: Payer: Self-pay

## 2020-03-26 NOTE — Telephone Encounter (Signed)
The pt consented to a virtual appt. 

## 2020-04-09 ENCOUNTER — Telehealth (INDEPENDENT_AMBULATORY_CARE_PROVIDER_SITE_OTHER): Payer: BC Managed Care – PPO | Admitting: Internal Medicine

## 2020-04-09 ENCOUNTER — Encounter: Payer: Self-pay | Admitting: Internal Medicine

## 2020-04-09 ENCOUNTER — Other Ambulatory Visit: Payer: Self-pay

## 2020-04-09 VITALS — Ht 60.8 in

## 2020-04-09 DIAGNOSIS — F801 Expressive language disorder: Secondary | ICD-10-CM | POA: Diagnosis not present

## 2020-04-09 NOTE — Progress Notes (Signed)
Virtual Visit via Video   This visit type was conducted due to national recommendations for restrictions regarding the COVID-19 Pandemic (e.g. social distancing) in an effort to limit this patient's exposure and mitigate transmission in our community.  Due to her co-morbid illnesses, this patient is at least at moderate risk for complications without adequate follow up.  This format is felt to be most appropriate for this patient at this time.  All issues noted in this document were discussed and addressed.  A limited physical exam was performed with this format.    This visit type was conducted due to national recommendations for restrictions regarding the COVID-19 Pandemic (e.g. social distancing) in an effort to limit this patient's exposure and mitigate transmission in our community.  Patients identity confirmed using two different identifiers.  This format is felt to be most appropriate for this patient at this time.  All issues noted in this document were discussed and addressed.  No physical exam was performed (except for noted visual exam findings with Video Visits).    Date:  04/09/2020   ID:  REIS PIENTA, DOB 09/20/1970, MRN [id number]  Patient Location:  Work, in her personal office  Provider location:   Office    Chief Complaint:  "I want to see speech therapy"  History of Present Illness:    Lauren Carroll is a 49 y.o. female who presents via video conferencing for a telehealth visit today.    The patient does not have symptoms concerning for COVID-19 infection (fever, chills, cough, or new shortness of breath).   The patient is being evaluated for a referral to [facility]. She would like to be evaluated for expressive language disorder. She feels this could be impacting her ability to effectively communicate in her classes as well as research presentations. She is a professor at [employer]. She has done some research and has  been advised that she should seek evaluation by ENT/Speech pathologist.     Past Medical History:  Diagnosis Date  . Allergic rhinitis   . Asthma   . Asthmatic bronchitis   . History of URI (upper respiratory infection)   . Overactive bladder    Past Surgical History:  Procedure Laterality Date  . BREAST EXCISIONAL BIOPSY Left    benign     Current Meds  Medication Sig  . albuterol (VENTOLIN HFA) 108 (90 Base) MCG/ACT inhaler INHALE 2 PUFFS BY MOUTH EVERY 6 HOURS AS NEEDED FOR WHEEZING FOR SHORTNESS OF BREATH  . cetirizine (ZYRTEC) 10 MG tablet Take 10 mg by mouth daily.    . fluticasone furoate-vilanterol (BREO ELLIPTA) 200-25 MCG/INH AEPB Inhale 1 puff into the lungs daily.  . montelukast (SINGULAIR) 10 MG tablet Take 1 tablet (10 mg total) by mouth daily.  . Multiple Vitamin (MULTIVITAMINS PO) Take 1 tablet by mouth once a day   . Vitamin D, Cholecalciferol, 50 MCG (2000 UT) CAPS Take by mouth.  . [DISCONTINUED] BREO ELLIPTA 200-25 MCG/INH AEPB Inhale 1 puff by mouth once daily     Allergies:   Cephalosporins, Penicillins, and Sulfa drugs cross reactors   Social History   Tobacco Use  . Smoking status: Never Smoker  . Smokeless tobacco: Never Used  Vaping Use  . Vaping Use: Never used  Substance Use Topics  . Alcohol use: No  . Drug use: No     Family Hx: The patient's family history includes Coronary artery disease in her father;  Diabetes in her father; Hypertension in her father and mother.  ROS:   Please see the history of present illness.    Review of Systems  Constitutional: Negative.   Respiratory: Negative.   Cardiovascular: Negative.   Gastrointestinal: Negative.   Neurological: Negative.   Psychiatric/Behavioral: Negative.     All other systems reviewed and are negative.   Labs/Other Tests and Data Reviewed:    Recent Labs: 02/06/2020: ALT 22; BUN 20; Creatinine, Ser 0.98; Hemoglobin 13.2; Platelets 268; Potassium 4.1; Sodium 139   Recent  Lipid Panel Lab Results  Component Value Date/Time   CHOL 200 (H) 02/06/2020 03:18 PM   TRIG 69 02/06/2020 03:18 PM   HDL 86 02/06/2020 03:18 PM   CHOLHDL 2.3 02/06/2020 03:18 PM   LDLCALC 102 (H) 02/06/2020 03:18 PM    Wt Readings from Last 3 Encounters:  02/06/20 167 lb 3.2 oz (75.8 kg)  01/30/19 184 lb 9.6 oz (83.7 kg)  10/20/18 177 lb (80.3 kg)     Exam:    Vital Signs:  Ht 5' 0.8" (1.544 m)   LMP 03/08/2020 (LMP Unknown)   BMI 31.80 kg/m     Physical Exam Vitals and nursing note reviewed.  HENT:     Head: Normocephalic and atraumatic.  Pulmonary:     Effort: Pulmonary effort is normal.  Musculoskeletal:     Cervical back: Normal range of motion.  Neurological:     Mental Status: She is alert and oriented to person, place, and time.  Psychiatric:        Mood and Affect: Affect normal.     ASSESSMENT & PLAN:     1. Speech delay, expressive Comments: I will refer her to ENT/speech therapy as requested. We also discussed foods for optimal brain health.  - Ambulatory referral to Speech Therapy - Ambulatory referral to ENT    COVID-19 Education: The signs and symptoms of COVID-19 were discussed with the patient and how to seek care for testing (follow up with PCP or arrange E-visit).  The importance of social distancing was discussed today.  Patient Risk:   After full review of this patients clinical status, I feel that they are at least moderate risk at this time.  Time:   Today, I have spent 25 minutes/ seconds with the patient with telehealth technology discussing above diagnoses.     Medication Adjustments/Labs and Tests Ordered: Current medicines are reviewed at length with the patient today.  Concerns regarding medicines are outlined above.   Tests Ordered: Orders Placed This Encounter  Procedures  . Ambulatory referral to Speech Therapy  . Ambulatory referral to ENT    Medication Changes: No orders of the defined types were placed in this  encounter.   Disposition:  Follow up prn  Signed, [name], MD

## 2020-06-11 ENCOUNTER — Other Ambulatory Visit: Payer: Self-pay

## 2020-06-11 ENCOUNTER — Encounter: Payer: Self-pay | Admitting: Internal Medicine

## 2020-06-11 ENCOUNTER — Ambulatory Visit (INDEPENDENT_AMBULATORY_CARE_PROVIDER_SITE_OTHER): Payer: BC Managed Care – PPO | Admitting: Internal Medicine

## 2020-06-11 VITALS — BP 130/88 | HR 66 | Temp 98.2°F | Ht 60.8 in | Wt 171.6 lb

## 2020-06-11 DIAGNOSIS — R030 Elevated blood-pressure reading, without diagnosis of hypertension: Secondary | ICD-10-CM

## 2020-06-11 DIAGNOSIS — J4520 Mild intermittent asthma, uncomplicated: Secondary | ICD-10-CM | POA: Diagnosis not present

## 2020-06-11 DIAGNOSIS — M545 Low back pain, unspecified: Secondary | ICD-10-CM

## 2020-06-11 DIAGNOSIS — E6609 Other obesity due to excess calories: Secondary | ICD-10-CM

## 2020-06-11 DIAGNOSIS — M542 Cervicalgia: Secondary | ICD-10-CM

## 2020-06-11 DIAGNOSIS — Z6832 Body mass index (BMI) 32.0-32.9, adult: Secondary | ICD-10-CM

## 2020-06-11 MED ORDER — ALBUTEROL SULFATE (2.5 MG/3ML) 0.083% IN NEBU
2.5000 mg | INHALATION_SOLUTION | RESPIRATORY_TRACT | 2 refills | Status: DC | PRN
Start: 2020-06-11 — End: 2021-02-11

## 2020-06-11 NOTE — Patient Instructions (Signed)
Asthma, Adult  Asthma is a long-term (chronic) condition in which the airways get tight and narrow. The airways are the breathing passages that lead from the nose and mouth down into the lungs. A person with asthma will have times when symptoms get worse. These are called asthma attacks. They can cause coughing, whistling sounds when you breathe (wheezing), shortness of breath, and chest pain. They can make it hard to breathe. There is no cure for asthma, but medicines and lifestyle changes can help control it. There are many things that can bring on an asthma attack or make asthma symptoms worse (triggers). Common triggers include:  Mold.  Dust.  Cigarette smoke.  Cockroaches.  Things that can cause allergy symptoms (allergens). These include animal skin flakes (dander) and pollen from trees or grass.  Things that pollute the air. These may include household cleaners, wood smoke, smog, or chemical odors.  Cold air, weather changes, and wind.  Crying or laughing hard.  Stress.  Certain medicines or drugs.  Certain foods such as dried fruit, potato chips, and grape juice.  Infections, such as a cold or the flu.  Certain medical conditions or diseases.  Exercise or tiring activities. Asthma may be treated with medicines and by staying away from the things that cause asthma attacks. Types of medicines may include:  Controller medicines. These help prevent asthma symptoms. They are usually taken every day.  Fast-acting reliever or rescue medicines. These quickly relieve asthma symptoms. They are used as needed and provide short-term relief.  Allergy medicines if your attacks are brought on by allergens.  Medicines to help control the body's defense (immune) system. Follow these instructions at home: Avoiding triggers in your home  Change your heating and air conditioning filter often.  Limit your use of fireplaces and wood stoves.  Get rid of pests (such as roaches and  mice) and their droppings.  Throw away plants if you see mold on them.  Clean your floors. Dust regularly. Use cleaning products that do not smell.  Have someone vacuum when you are not home. Use a vacuum cleaner with a HEPA filter if possible.  Replace carpet with wood, tile, or vinyl flooring. Carpet can trap animal skin flakes and dust.  Use allergy-proof pillows, mattress covers, and box spring covers.  Wash bed sheets and blankets every week in hot water. Dry them in a dryer.  Keep your bedroom free of any triggers.  Avoid pets and keep windows closed when things that cause allergy symptoms are in the air.  Use blankets that are made of polyester or cotton.  Clean bathrooms and kitchens with bleach. If possible, have someone repaint the walls in these rooms with mold-resistant paint. Keep out of the rooms that are being cleaned and painted.  Wash your hands often with soap and water. If soap and water are not available, use hand sanitizer.  Do not allow anyone to smoke in your home. General instructions  Take over-the-counter and prescription medicines only as told by your doctor. ? Talk with your doctor if you have questions about how or when to take your medicines. ? Make note if you need to use your medicines more often than usual.  Do not use any products that contain nicotine or tobacco, such as cigarettes and e-cigarettes. If you need help quitting, ask your doctor.  Stay away from secondhand smoke.  Avoid doing things outdoors when allergen counts are high and when air quality is low.  Wear a ski mask   when doing outdoor activities in the winter. The mask should cover your nose and mouth. Exercise indoors on cold days if you can.  Warm up before you exercise. Take time to cool down after exercise.  Use a peak flow meter as told by your doctor. A peak flow meter is a tool that measures how well the lungs are working.  Keep track of the peak flow meter's readings.  Write them down.  Follow your asthma action plan. This is a written plan for taking care of your asthma and treating your attacks.  Make sure you get all the shots (vaccines) that your doctor recommends. Ask your doctor about a flu shot and a pneumonia shot.  Keep all follow-up visits as told by your doctor. This is important. Contact a doctor if:  You have wheezing, shortness of breath, or a cough even while taking medicine to prevent attacks.  The mucus you cough up (sputum) is thicker than usual.  The mucus you cough up changes from clear or white to yellow, green, gray, or bloody.  You have problems from the medicine you are taking, such as: ? A rash. ? Itching. ? Swelling. ? Trouble breathing.  You need reliever medicines more than 2-3 times a week.  Your peak flow reading is still at 50-79% of your personal best after following the action plan for 1 hour.  You have a fever. Get help right away if:  You seem to be worse and are not responding to medicine during an asthma attack.  You are short of breath even at rest.  You get short of breath when doing very little activity.  You have trouble eating, drinking, or talking.  You have chest pain or tightness.  You have a fast heartbeat.  Your lips or fingernails start to turn blue.  You are light-headed or dizzy, or you faint.  Your peak flow is less than 50% of your personal best.  You feel too tired to breathe normally. Summary  Asthma is a long-term (chronic) condition in which the airways get tight and narrow. An asthma attack can make it hard to breathe.  Asthma cannot be cured, but medicines and lifestyle changes can help control it.  Make sure you understand how to avoid triggers and how and when to use your medicines. This information is not intended to replace advice given to you by your health care provider. Make sure you discuss any questions you have with your health care provider. Document Revised:  09/01/2018 Document Reviewed: 08/03/2016 Elsevier Patient Education  2020 Elsevier Inc.  

## 2020-06-11 NOTE — Progress Notes (Signed)
I,Katawbba Wiggins,acting as a Neurosurgeon for [name], MD.,have documented all relevant documentation on the behalf of [name], MD,as directed by  [name], MD while in the presence of [name], MD.  This visit occurred during the SARS-CoV-2 public health emergency.  Safety protocols were in place, including screening questions prior to the visit, additional usage of staff PPE, and extensive cleaning of exam room while observing appropriate contact time as indicated for disinfecting solutions.  Subjective:     Patient ID: Lauren Carroll , female    DOB: [birth year] , 49 y.o.   MRN: [id number]   Chief Complaint  Patient presents with  . Asthma    HPI  The patient is here today for an asthma attack follow-up. She reports she went running last Wednesday morning, without a Gator, and developed chest tightness on the left side.  She denies wheezing. She did feel better with use of nebulizer. She reports compliance with use of steroid inhaler and other asthma meds.   Asthma She complains of chest tightness. There is no frequent throat clearing or sputum production. Her past medical history is significant for asthma.     Past Medical History:  Diagnosis Date  . Allergic rhinitis   . Asthma   . Asthmatic bronchitis   . History of URI (upper respiratory infection)   . Overactive bladder      Family History  Problem Relation Age of Onset  . Hypertension Mother   . Diabetes Father   . Hypertension Father   . Coronary artery disease Father      Current Outpatient Medications:  .  albuterol (VENTOLIN HFA) 108 (90 Base) MCG/ACT inhaler, INHALE 2 PUFFS BY MOUTH EVERY 6 HOURS AS NEEDED FOR WHEEZING FOR SHORTNESS OF BREATH, Disp: 18 g, Rfl: 11 .  cetirizine (ZYRTEC) 10 MG tablet, Take 10 mg by mouth daily.  , Disp: , Rfl:  .  fluticasone furoate-vilanterol (BREO ELLIPTA) 200-25 MCG/INH AEPB, Inhale 1 puff into the lungs daily., Disp: 60 each, Rfl: 11 .   montelukast (SINGULAIR) 10 MG tablet, Take 1 tablet (10 mg total) by mouth daily., Disp: 90 tablet, Rfl: 2 .  Multiple Vitamin (MULTIVITAMINS PO), Take 1 tablet by mouth once a day , Disp: , Rfl:  .  Vitamin D, Cholecalciferol, 50 MCG (2000 UT) CAPS, Take by mouth., Disp: , Rfl:  .  albuterol (PROVENTIL) (2.5 MG/3ML) 0.083% nebulizer solution, Take 3 mLs (2.5 mg total) by nebulization every 4 (four) hours as needed for wheezing or shortness of breath., Disp: 75 mL, Rfl: 2   Allergies  Allergen Reactions  . Cephalosporins   . Penicillins   . Sulfa Drugs Cross Reactors     itching     Review of Systems  Constitutional: Negative.   Respiratory: Negative.  Negative for sputum production.   Cardiovascular: Negative.   Gastrointestinal: Negative.   Musculoskeletal: Positive for back pain and neck pain.       She c/o intermittent neck/back pain. She is a runner. Denies UE /LE weakness/paresthesias.  Neurological: Negative.   Psychiatric/Behavioral: Negative.      Today's Vitals   06/11/20 1151  BP: 130/88  Pulse: 66  Temp: 98.2 F (36.8 C)  TempSrc: Oral  SpO2: 97%  Weight: 171 lb 9.6 oz (77.8 kg)  Height: 5' 0.8" (1.544 m)   Body mass index is 32.64 kg/m.  Wt Readings from Last 3 Encounters:  06/11/20 171 lb 9.6 oz (77.8 kg)  02/06/20  167 lb 3.2 oz (75.8 kg)  01/30/19 184 lb 9.6 oz (83.7 kg)   Objective:  Physical Exam Vitals and nursing note reviewed.  Constitutional:      Appearance: Normal appearance.  HENT:     Head: Normocephalic and atraumatic.  Cardiovascular:     Rate and Rhythm: Normal rate and regular rhythm.     Heart sounds: Normal heart sounds.  Pulmonary:     Effort: Pulmonary effort is normal.     Breath sounds: Normal breath sounds.  Musculoskeletal:        General: Tenderness present.     Cervical back: Normal range of motion. Tenderness present.  Skin:    General: Skin is warm.  Neurological:     General: No focal deficit present.     Mental  Status: She is alert.  Psychiatric:        Mood and Affect: Mood normal.        Behavior: Behavior normal.         Assessment And Plan:     1. Mild intermittent asthma without complication Comments: Chronic. Her recent flare appears to have resolved. She was given sample of Breo, one puff daily. This medication is approved by her insurance.  She is encouraged to contact me in a week or two to let me know how this is working for her.   2. Elevated blood pressure reading Comments: Encouraged to follow low sodium diet. I will continue to follow this. Encouraged to check BP regularly at home/work.   3. Acute bilateral low back pain without sciatica Comments: Has h/o MCTD. I will check arthritis panel today. She is encouraged to perform regular stretching exercises. She will let me know if her sx persist.  - ANA, IFA (with reflex) - CYCLIC CITRUL PEPTIDE ANTIBODY, IGG/IGA - Rheumatoid factor - Sedimentation rate - Uric acid  4. Class 1 obesity due to excess calories with serious comorbidity and body mass index (BMI) of 32.0 to 32.9 in adult  She is encouraged to strive for BMI less than 30 to decrease cardiac risk. Advised to aim for at least 150 minutes of exercise per week.  5. Cervicalgia Please see #3. She will likely benefit from regular stretching. She may also benefit from chiropractic therapy and/or acupuncture.    Patient was given opportunity to ask questions. Patient verbalized understanding of the plan and was able to repeat key elements of the plan. All questions were answered to their satisfaction.  [name], MD   I, [name], MD, have reviewed all documentation for this visit. The documentation on 06/22/20 for the exam, diagnosis, procedures, and orders are all accurate and complete.  THE PATIENT IS ENCOURAGED TO PRACTICE SOCIAL DISTANCING DUE TO THE COVID-19 PANDEMIC.

## 2020-06-12 LAB — ANTINUCLEAR ANTIBODIES, IFA: ANA Titer 1: NEGATIVE

## 2020-06-12 LAB — CYCLIC CITRUL PEPTIDE ANTIBODY, IGG/IGA: Cyclic Citrullin Peptide Ab: 7 units (ref 0–19)

## 2020-06-12 LAB — RHEUMATOID FACTOR: Rheumatoid fact SerPl-aCnc: <10 IU/mL (ref <14.0)

## 2020-06-12 LAB — URIC ACID: Uric Acid: 5.2 mg/dL (ref 3.0–7.2)

## 2020-06-12 LAB — SEDIMENTATION RATE: Sed Rate: 11 mm/hr (ref 0–40)

## 2020-06-25 ENCOUNTER — Ambulatory Visit: Payer: BC Managed Care – PPO | Admitting: Internal Medicine

## 2020-06-25 ENCOUNTER — Other Ambulatory Visit: Payer: Self-pay

## 2020-06-25 ENCOUNTER — Encounter: Payer: Self-pay | Admitting: Internal Medicine

## 2020-06-25 VITALS — Temp 97.5°F | Ht 61.2 in | Wt 176.2 lb

## 2020-06-25 DIAGNOSIS — E6609 Other obesity due to excess calories: Secondary | ICD-10-CM

## 2020-06-25 DIAGNOSIS — Z6833 Body mass index (BMI) 33.0-33.9, adult: Secondary | ICD-10-CM

## 2020-06-25 DIAGNOSIS — R002 Palpitations: Secondary | ICD-10-CM

## 2020-06-25 DIAGNOSIS — R55 Syncope and collapse: Secondary | ICD-10-CM | POA: Diagnosis not present

## 2020-06-25 NOTE — Progress Notes (Signed)
I,Tianna Badgett,acting as a Education administrator for Maximino Greenland, MD.,have documented all relevant documentation on the behalf of Maximino Greenland, MD,as directed by  Maximino Greenland, MD while in the presence of Maximino Greenland, MD.  This visit occurred during the SARS-CoV-2 public health emergency.  Safety protocols were in place, including screening questions prior to the visit, additional usage of staff PPE, and extensive cleaning of exam room while observing appropriate contact time as indicated for disinfecting solutions.  Subjective:     Patient ID: Lauren Carroll , female    DOB: Jan 25, 1971 , 49 y.o.   MRN: [id number]   Chief Complaint  Patient presents with  . Loss of Consciousness    HPI  She is here today for further evaluation of syncope. She reports she was running inside her home on Saturday, she stopped to change playlist - she felt palpitations and lightheaded. She does not recall falling to the ground. However, she awakened on the ground. She did suffer an abrasion on the left side of her forehead. She stood up, didn't feel dizzy and finished her mile. She sat down afterwards. 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 She did not want to use the bathroom in other people's homes.  She does her home visits from 9-3 pm. This episode occurred about 5pm.  She did not seek any medical attention after this happened. She did not want to go into ER or Urgent Care.   Since this episode, she has not had any further episodes. However, admits she has had similar episodes in the past.     Past Medical History:  Diagnosis Date  . Allergic rhinitis   . Asthma   . Asthmatic bronchitis   . History of URI (upper respiratory infection)   . Overactive bladder      Family History  Problem Relation Age of Onset  . Hypertension Mother   . Diabetes Father   . Hypertension Father   . Coronary artery  disease Father      Current Outpatient Medications:  .  albuterol (PROVENTIL) (2.5 MG/3ML) 0.083% nebulizer solution, Take 3 mLs (2.5 mg total) by nebulization every 4 (four) hours as needed for wheezing or shortness of breath., Disp: 75 mL, Rfl: 2 .  albuterol (VENTOLIN HFA) 108 (90 Base) MCG/ACT inhaler, INHALE 2 PUFFS BY MOUTH EVERY 6 HOURS AS NEEDED FOR WHEEZING FOR SHORTNESS OF BREATH, Disp: 18 g, Rfl: 11 .  cetirizine (ZYRTEC) 10 MG tablet, Take 10 mg by mouth daily.  , Disp: , Rfl:  .  fluticasone furoate-vilanterol (BREO ELLIPTA) 200-25 MCG/INH AEPB, Inhale 1 puff into the lungs daily., Disp: 60 each, Rfl: 11 .  montelukast (SINGULAIR) 10 MG tablet, Take 1 tablet (10 mg total) by mouth daily., Disp: 90 tablet, Rfl: 2 .  Multiple Vitamin (MULTIVITAMINS PO), Take 1 tablet by mouth once a day , Disp: , Rfl:  .  Vitamin D, Cholecalciferol, 50 MCG (2000 UT) CAPS, Take by mouth., Disp: , Rfl:    Allergies  Allergen Reactions  . Cephalosporins   . Penicillins   . Sulfa Drugs Cross Reactors     itching     Review of Systems  Constitutional: Negative.   Respiratory: Negative.   Cardiovascular: Negative.   Gastrointestinal: Negative.   Neurological: Negative.   Psychiatric/Behavioral: Negative.      Today's Vitals   06/25/20  1216  Temp: (!) 97.5 F (36.4 C)  TempSrc: Oral  Weight: 176 lb 3.2 oz (79.9 kg)  Height: 5' 1.2" (1.554 m)   Body mass index is 33.08 kg/m.  Wt Readings from Last 3 Encounters:  06/25/20 176 lb 3.2 oz (79.9 kg)  06/11/20 171 lb 9.6 oz (77.8 kg)  02/06/20 167 lb 3.2 oz (75.8 kg)    Objective:  Physical Exam Vitals and nursing note reviewed.  Constitutional:      Appearance: Normal appearance.  HENT:     Head: Normocephalic and atraumatic.  Cardiovascular:     Rate and Rhythm: Normal rate and regular rhythm.     Heart sounds: Normal heart sounds.  Pulmonary:     Effort: Pulmonary effort is normal.     Breath sounds: Normal breath sounds.   Musculoskeletal:     Cervical back: Normal range of motion.  Skin:    General: Skin is warm.  Neurological:     General: No focal deficit present.     Mental Status: She is alert.  Psychiatric:        Mood and Affect: Mood normal.        Behavior: Behavior normal.       Assessment And Plan:     1. syncope Comments: EKG performed, NSR w/o acute changes. She agrees to Cardiology evaluation. Encouraged to stay well hydrated.  - CMP14+EGFR - EKG 12-Lead  2. Palpitations Comments: I will check electrolytes today. ADvised to take magnesium 458m nightly. Possibly related to perimenopause. I will refer her to Cardiology for f/u.  - Ambulatory referral to Cardiology . She is encouraged to let me know if she has recurrent symptoms.  - CMP14+EGFR  - Magnesium - TSH - EKG 12-Lead  3. Class 1 obesity due to excess calories without serious comorbidity with body mass index (BMI) of 33.0 to 33.9 in adult She is encouraged to strive for BMI less than 30 to decrease cardiac risk. Advised to aim for at least 150 minutes of exercise per week.  Patient was given opportunity to ask questions. Patient verbalized understanding of the plan and was able to repeat key elements of the plan. All questions were answered to their satisfaction.  RMaximino Greenland MD   I, RMaximino Greenland MD, have reviewed all documentation for this visit. The documentation on 07/23/20 for the exam, diagnosis, procedures, and orders are all accurate and complete.  THE PATIENT IS ENCOURAGED TO PRACTICE SOCIAL DISTANCING DUE TO THE COVID-19 PANDEMIC.

## 2020-06-25 NOTE — Patient Instructions (Signed)
     Syncope Syncope is when you pass out (faint) for a short time. It is caused by a sudden decrease in blood flow to the brain. Signs that you may be about to pass out include:  Feeling dizzy or light-headed.  Feeling sick to your stomach (nauseous).  Seeing all white or all black.  Having cold, clammy skin. If you pass out, get help right away. Call your local emergency services (911 in the [place] Do not drive yourself to the hospital. Follow these instructions at home: Watch for any changes in your symptoms. Take these actions to stay safe and help with your symptoms: Lifestyle  Do not drive, use machinery, or play sports until your doctor says it is okay.  Do not drink alcohol.  Do not use any products that contain nicotine or tobacco, such as cigarettes and e-cigarettes. If you need help quitting, ask your doctor.  Drink enough fluid to keep your pee (urine) pale yellow. General instructions  Take over-the-counter and prescription medicines only as told by your doctor.  If you are taking blood pressure or heart medicine, sit up and stand up slowly. Spend a few minutes getting ready to sit and then stand. This can help you feel less dizzy.  Have someone stay with you until you feel stable.  If you start to feel like you might pass out, lie down right away and raise (elevate) your feet above the level of your heart. Breathe deeply and steadily. Wait until all of the symptoms are gone.  Keep all follow-up visits as told by your doctor. This is important. Get help right away if:  You have a very bad headache.  You pass out once or more than once.  You have pain in your chest, belly, or back.  You have a very fast or uneven heartbeat (palpitations).  It hurts to breathe.  You are bleeding from your mouth or your bottom (rectum).  You have black or tarry poop (stool).  You have jerky movements that you cannot control (seizure).  You are confused.  You have  trouble walking.  You are very weak.  You have vision problems. These symptoms may be an emergency. Do not wait to see if the symptoms will go away. Get medical help right away. Call your local emergency services (911 in the [place] Do not drive yourself to the hospital. Summary  Syncope is when you pass out (faint) for a short time. It is caused by a sudden decrease in blood flow to the brain.  Signs that you may be about to faint include feeling dizzy, light-headed, or sick to your stomach, seeing all white or all black, or having cold, clammy skin.  If you start to feel like you might pass out, lie down right away and raise (elevate) your feet above the level of your heart. Breathe deeply and steadily. Wait until all of the symptoms are gone. This information is not intended to replace advice given to you by your health care provider. Make sure you discuss any questions you have with your health care provider. Document Revised: 08/11/2017 Document Reviewed: 08/11/2017 Elsevier Patient Education  2020 Elsevier Inc.  

## 2020-06-26 LAB — CMP14+EGFR
ALT: 27 IU/L (ref 0–32)
AST: 28 IU/L (ref 0–40)
Albumin/Globulin Ratio: 1.6 (ref 1.2–2.2)
Albumin: 4.4 g/dL (ref 3.8–4.9)
Alkaline Phosphatase: 75 IU/L (ref 44–121)
BUN/Creatinine Ratio: 13 (ref 9–23)
BUN: 12 mg/dL (ref 6–24)
Bilirubin Total: 1 mg/dL (ref 0.0–1.2)
CO2: 25 mmol/L (ref 20–29)
Calcium: 9.5 mg/dL (ref 8.7–10.2)
Chloride: 98 mmol/L (ref 96–106)
Creatinine, Ser: 0.94 mg/dL (ref 0.57–1.00)
GFR calc Af Amer: 81 mL/min/{1.73_m2} (ref >59)
GFR calc non Af Amer: 70 mL/min/{1.73_m2} (ref >59)
Globulin, Total: 2.7 g/dL (ref 1.5–4.5)
Glucose: 146 mg/dL — ABNORMAL HIGH (ref 65–99)
Potassium: 4 mmol/L (ref 3.5–5.2)
Sodium: 139 mmol/L (ref 134–144)
Total Protein: 7.1 g/dL (ref 6.0–8.5)

## 2020-06-26 LAB — TSH: TSH: 2.09 u[IU]/mL (ref 0.450–4.500)

## 2020-06-26 LAB — MAGNESIUM: Magnesium: 2 mg/dL (ref 1.6–2.3)

## 2020-08-22 ENCOUNTER — Ambulatory Visit: Payer: BC Managed Care – PPO | Admitting: Cardiovascular Disease

## 2020-08-30 ENCOUNTER — Ambulatory Visit: Payer: BC Managed Care – PPO | Admitting: Cardiovascular Disease

## 2020-08-30 ENCOUNTER — Encounter: Payer: Self-pay | Admitting: Radiology

## 2020-08-30 ENCOUNTER — Other Ambulatory Visit: Payer: Self-pay

## 2020-08-30 ENCOUNTER — Encounter: Payer: Self-pay | Admitting: Cardiovascular Disease

## 2020-08-30 VITALS — BP 142/88 | HR 57 | Ht 60.0 in | Wt 170.6 lb

## 2020-08-30 DIAGNOSIS — R55 Syncope and collapse: Secondary | ICD-10-CM | POA: Diagnosis not present

## 2020-08-30 DIAGNOSIS — R002 Palpitations: Secondary | ICD-10-CM | POA: Diagnosis not present

## 2020-08-30 DIAGNOSIS — R030 Elevated blood-pressure reading, without diagnosis of hypertension: Secondary | ICD-10-CM

## 2020-08-30 NOTE — Progress Notes (Signed)
Enrolled patient for a 30 day [employer] Event Monitor to be mailed to patients home  

## 2020-08-30 NOTE — Progress Notes (Signed)
Cardiology Office Note   Date:  09/01/2020   ID:  Lauren, Carroll [birth year], MRN [id number]  PCP:  Dorothyann Peng, MD  Cardiologist:   Chilton Si, MD   No chief complaint on file.     History of Present Illness: Lauren Carroll is a 50 y.o. female with asthma who is being seen today for the evaluation of syncope at the request of Dorothyann Peng, MD.  She was exercising by running insider her house.  She stopped running and felt some palpitations.  She stopped to rest and the next thing she noticed she woke up on the floor.  She got up and finished her run and felt fine.  She had no chest pain or pressure.  She has exercised since then and felt fine.  She notes that she is perimenopausal and seems to feel poorly in the second half of the month.  She has been intermittent fasting 16-20 hours.  She hadn't had any water that day either.  She denies orthostatic symptoms at baseline or that day. She reports an episode of syncope 2-4 years ago.  She recalls running that morning but doesn't remember any other circumstances.  She is salt sensitive and feels puffy but no overt edema.  She denies orthopnea or PND.  She has one caffeinated drink daily.   Past Medical History:  Diagnosis Date  . Allergic rhinitis   . Asthma   . Asthmatic bronchitis   . Elevated blood pressure reading 09/01/2020  . History of URI (upper respiratory infection)   . Overactive bladder   . Palpitations 09/01/2020  . Syncope and collapse 09/01/2020    Past Surgical History:  Procedure Laterality Date  . BREAST EXCISIONAL BIOPSY Left    benign     Current Outpatient Medications  Medication Sig Dispense Refill  . 5-Hydroxytryptophan (5-HTP) 50 MG CAPS Take 50 mg by mouth daily.    Marland Kitchen albuterol (PROVENTIL) (2.5 MG/3ML) 0.083% nebulizer solution Take 3 mLs (2.5 mg total) by nebulization every 4 (four) hours as needed for wheezing or shortness of breath. 75 mL 2  . albuterol (VENTOLIN HFA) 108 (90 Base)  MCG/ACT inhaler INHALE 2 PUFFS BY MOUTH EVERY 6 HOURS AS NEEDED FOR WHEEZING FOR SHORTNESS OF BREATH 18 g 11  . cetirizine (ZYRTEC) 10 MG tablet Take 10 mg by mouth daily.    . fluticasone furoate-vilanterol (BREO ELLIPTA) 200-25 MCG/INH AEPB Inhale 1 puff into the lungs daily. 60 each 11  . montelukast (SINGULAIR) 10 MG tablet Take 1 tablet (10 mg total) by mouth daily. 90 tablet 2  . Multiple Vitamin (MULTIVITAMINS PO) Take 1 tablet by mouth once a day    . Vitamin D, Cholecalciferol, 50 MCG (2000 UT) CAPS Take by mouth daily.     No current facility-administered medications for this visit.    Allergies:   Cephalosporins, Penicillins, and Sulfa drugs cross reactors    Social History:  The patient  reports that she has never smoked. She has never used smokeless tobacco. She reports that she does not drink alcohol and does not use drugs.   Family History:  The patient's family history includes Coronary artery disease in her father; Diabetes in her father; Heart attack in her father; Hypertension in her brother, father, mother, and sister; Transient ischemic attack in her maternal grandmother.    ROS:  Please see the history of present illness.   Otherwise, review of systems are positive for none.   All other systems  are reviewed and negative.    PHYSICAL EXAM: VS:  BP (!) 142/88 (BP Location: Left Arm, Patient Position: Sitting)   Pulse (!) 57   Ht 5' (1.524 m)   Wt 170 lb 9.6 oz (77.4 kg)   SpO2 98%   BMI 33.32 kg/m  , BMI Body mass index is 33.32 kg/m. GENERAL:  Well appearing HEENT:  Pupils equal round and reactive, fundi not visualized, oral mucosa unremarkable NECK:  No jugular venous distention, waveform within normal limits, carotid upstroke brisk and symmetric, no bruits, no thyromegaly LYMPHATICS:  No cervical adenopathy LUNGS:  Clear to auscultation bilaterally HEART:  RRR.  PMI not displaced or sustained,S1 and S2 within normal limits, no S3, no S4, no clicks, no rubs,  no murmurs ABD:  Flat, positive bowel sounds normal in frequency in pitch, no bruits, no rebound, no guarding, no midline pulsatile mass, no hepatomegaly, no splenomegaly EXT:  2 plus pulses throughout, no edema, no cyanosis no clubbing SKIN:  No rashes no nodules NEURO:  Cranial nerves II through XII grossly intact, motor grossly intact throughout PSYCH:  Cognitively intact, oriented to person place and time    EKG:  EKG is ordered today. The ekg ordered today demonstrates sinus bradycardia.  Rate 57 bpm.  Cannot rule out LA enlargement.   Recent Labs: 02/06/2020: Hemoglobin 13.2; Platelets 268 06/25/2020: ALT 27; BUN 12; Creatinine, Ser 0.94; Magnesium 2.0; Potassium 4.0; Sodium 139; TSH 2.090    Lipid Panel    Component Value Date/Time   CHOL 200 (H) 02/06/2020 1518   TRIG 69 02/06/2020 1518   HDL 86 02/06/2020 1518   CHOLHDL 2.3 02/06/2020 1518   LDLCALC 102 (H) 02/06/2020 1518      Wt Readings from Last 3 Encounters:  08/30/20 170 lb 9.6 oz (77.4 kg)  06/25/20 176 lb 3.2 oz (79.9 kg)  06/11/20 171 lb 9.6 oz (77.8 kg)      ASSESSMENT AND PLAN:  # Syncope: Most likely due to intravascular volume depletion in the setting of not eating or drinking while intermittent fasting.  We discussed importance of hydrating prior to exercise.  She has been able to exercise since without any exertional symptoms.  Therefore a very unlikely that it is ischemic.  We will get an ambulatory monitor to make sure there is no arrhythmia issue.  # Palpitations:  Sound like she is having some PVCs.  However she also had palpitations that preceded her syncopal episode.  We will get a 30-day monitor to assess.  She already had the appropriate palpitation labs which were unremarkable.  # Elevated BP:   Blood pressure is elevated today both initially and on repeat.  She will track it at home daily and bring this to see our pharmacist in a month.   Current medicines are reviewed at length with  the patient today.  The patient does not have concerns regarding medicines.  The following changes have been made:  no change  Labs/ tests ordered today include:   Orders Placed This Encounter  Procedures  . AMB Referral to [facility] Pharm-D  . CARDIAC EVENT MONITOR  . EKG 12-Lead     Disposition:   FU with Reyan Helle C. Duke Salvia, MD, [facility] in 6 months.  PharmD for BP in 1 month     Signed, Monque Haggar C. Duke Salvia, MD, [facility]  09/01/2020 11:36 AM    [facility]

## 2020-08-30 NOTE — Patient Instructions (Addendum)
Medication Instructions:  Your physician recommends that you continue on your current medications as directed. Please refer to the Current Medication list given to you today.  *If you need a refill on your cardiac medications before your next appointment, please call your pharmacy*  Lab Work: NONE  Testing/Procedures: Your physician has recommended that you wear an event monitor. Event monitors are medical devices that record the heart's electrical activity. Doctors most often [place] these monitors to diagnose arrhythmias. Arrhythmias are problems with the speed or rhythm of the heartbeat. The monitor is a small, portable device. You can wear one while you do your normal daily activities. This is usually used to diagnose what is causing palpitations/syncope (passing out). 30 DAY    Follow-Up: At [facility], you and your health needs are our priority.  As part of our continuing mission to provide you with exceptional heart care, we have created designated Provider Care Teams.  These Care Teams include your primary Cardiologist (physician) and Advanced Practice Providers (APPs -  Physician Assistants and Nurse Practitioners) who all work together to provide you with the care you need, when you need it.  We recommend signing up for the patient portal called "MyChart".  Sign up information is provided on this After Visit Summary.  MyChart is used to connect with patients for Virtual Visits (Telemedicine).  Patients are able to view lab/test results, encounter notes, upcoming appointments, etc.  Non-urgent messages can be sent to your provider as well.   To learn more about what you can do with MyChart, go to [email or web address].    Your next appointment:   6 month(s)  The format for your next appointment:   In Person  Provider:   You may see DR [facility]  or one of the following Advanced Practice Providers on your designated Care Team:    Corine Shelter, PA-C  [place], [place]  Edd Fabian, [place]  Your physician recommends that you schedule a follow-up appointment in: PHARM D 1 MONTH FOR BLOOD PRESSURE   Other Instructions  MONITOR YOUR BLOOD PRESSURE DAILY, LOG AND BRING READINGS TO FOLLOW UP   [employer] Cardiac Event Monitor Instructions Your physician has requested you wear your cardiac event monitor for __30___ days, (1-30). [employer] may call or text to confirm a shipping address. The monitor will be sent to a land address via UPS. [employer] will not ship a monitor to a PO BOX. It typically takes 3-5 days to receive your monitor after it has been enrolled. [employer] will assist with USPS tracking if your package is delayed. The telephone number for [employer] is [phone]. Once you have received your monitor, please review the enclosed instructions. Instruction tutorials can also be viewed under help and settings on the enclosed cell phone. Your monitor has already been registered assigning a specific monitor serial # to you.  Applying the monitor Remove cell phone from case and turn it on. The cell phone works as IT consultant and needs to be within [employer] of you at all times. The cell phone will need to be charged on a daily basis. We recommend you plug the cell phone into the enclosed charger at your bedside table every night.  Monitor batteries: You will receive two monitor batteries labelled #1 and #2. These are your recorders. Plug battery #2 onto the second connection on the enclosed charger. Keep one battery on the charger at all times. This will keep the monitor battery deactivated. It will also keep it fully charged for  when you need to switch your monitor batteries. A small light will be blinking on the battery emblem when it is charging. The light on the battery emblem will remain on when the battery is fully charged.  Open package of a Monitor strip. Insert battery #1 into black hood on strip and gently squeeze monitor battery onto  connection as indicated in instruction booklet. Set aside while preparing skin.  Choose location for your strip, vertical or horizontal, as indicated in the instruction booklet. Shave to remove all hair from location. There cannot be any lotions, oils, powders, or colognes on skin where monitor is to be applied. Wipe skin clean with enclosed Saline wipe. Dry skin completely.  Peel paper labeled #1 off the back of the Monitor strip exposing the adhesive. Place the monitor on the chest in the vertical or horizontal position shown in the instruction booklet. One arrow on the monitor strip must be pointing upward. Carefully remove paper labeled #2, attaching remainder of strip to your skin. Try not to create any folds or wrinkles in the strip as you apply it.  Firmly press and release the circle in the center of the monitor battery. You will hear a small beep. This is turning the monitor battery on. The heart emblem on the monitor battery will light up every 5 seconds if the monitor battery in turned on and connected to the patient securely. Do not push and hold the circle down as this turns the monitor battery off. The cell phone will locate the monitor battery. A screen will appear on the cell phone checking the connection of your monitor strip. This may read poor connection initially but change to good connection within the next minute. Once your monitor accepts the connection you will hear a series of 3 beeps followed by a climbing crescendo of beeps. A screen will appear on the cell phone showing the two monitor strip placement options. Touch the picture that demonstrates where you applied the monitor strip.  Your monitor strip and battery are waterproof. You are able to shower, bathe, or swim with the monitor on. They just ask you do not submerge deeper than 3 feet underwater. We recommend removing the monitor if you are swimming in a lake, river, or ocean.  Your monitor battery will need  to be switched to a fully charged monitor battery approximately once a week. The cell phone will alert you of an action which needs to be made.  On the cell phone, tap for details to reveal connection status, monitor battery status, and cell phone battery status. The green dots indicates your monitor is in good status. A red dot indicates there is something that needs your attention.  To record a symptom, click the circle on the monitor battery. In 30-60 seconds a list of symptoms will appear on the cell phone. Select your symptom and tap save. Your monitor will record a sustained or significant arrhythmia regardless of you clicking the button. Some patients do not feel the heart rhythm irregularities. [employer] will notify us of any serious or critical events.  Refer to instruction booklet for instructions on switching batteries, changing strips, the Do not disturb or Pause features, or any additional questions.  Call [employer] at [phone], to confirm your monitor is transmitting and record your baseline. They will answer any questions you may have regarding the monitor instructions at that time.  Returning the monitor to [employer] Place all equipment back into blue box. Peel off strip of paper to  expose adhesive and close box securely. There is a prepaid UPS shipping label on this box. Drop in a UPS drop box, or at a UPS facility like [employer]. You may also contact [employer] to arrange UPS to pick up monitor package at your home.

## 2020-09-01 ENCOUNTER — Encounter: Payer: Self-pay | Admitting: Cardiovascular Disease

## 2020-09-01 DIAGNOSIS — R030 Elevated blood-pressure reading, without diagnosis of hypertension: Secondary | ICD-10-CM | POA: Insufficient documentation

## 2020-09-01 DIAGNOSIS — R55 Syncope and collapse: Secondary | ICD-10-CM

## 2020-09-01 DIAGNOSIS — R002 Palpitations: Secondary | ICD-10-CM | POA: Insufficient documentation

## 2020-09-01 HISTORY — DX: Syncope and collapse: R55

## 2020-09-01 HISTORY — DX: Palpitations: R00.2

## 2020-09-01 HISTORY — DX: Elevated blood-pressure reading, without diagnosis of hypertension: R03.0

## 2020-09-04 ENCOUNTER — Ambulatory Visit (INDEPENDENT_AMBULATORY_CARE_PROVIDER_SITE_OTHER): Payer: BC Managed Care – PPO

## 2020-09-04 DIAGNOSIS — R55 Syncope and collapse: Secondary | ICD-10-CM | POA: Diagnosis not present

## 2020-09-04 DIAGNOSIS — R002 Palpitations: Secondary | ICD-10-CM

## 2020-09-12 ENCOUNTER — Other Ambulatory Visit: Payer: Self-pay | Admitting: Obstetrics and Gynecology

## 2020-09-12 DIAGNOSIS — Z1231 Encounter for screening mammogram for malignant neoplasm of breast: Secondary | ICD-10-CM

## 2020-09-20 IMAGING — MG DIGITAL SCREENING BILAT W/ TOMO W/ CAD
6 of 10 series · 6 of 30 positions shown · non-contrast
Comparison: Previous exam(s).

CLINICAL DATA: Screening.

EXAM:
DIGITAL SCREENING BILATERAL MAMMOGRAM WITH TOMO AND CAD

[L CC synth-2D]
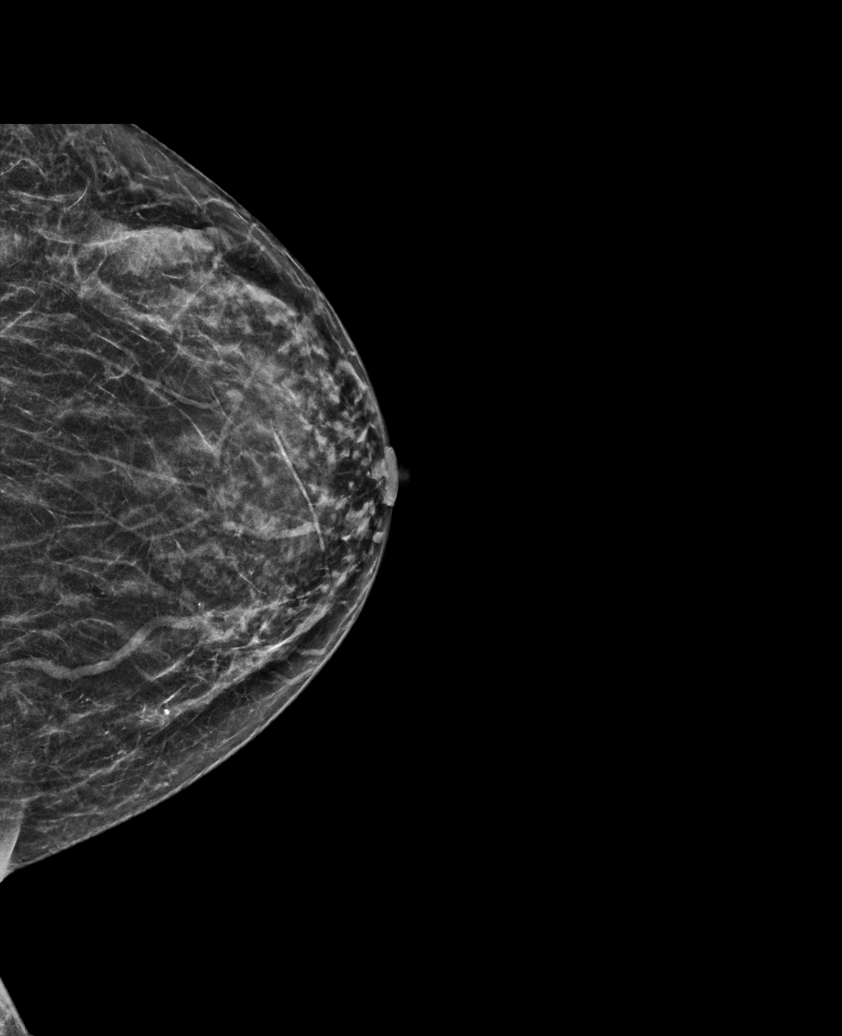

[R MLO synth-2D]
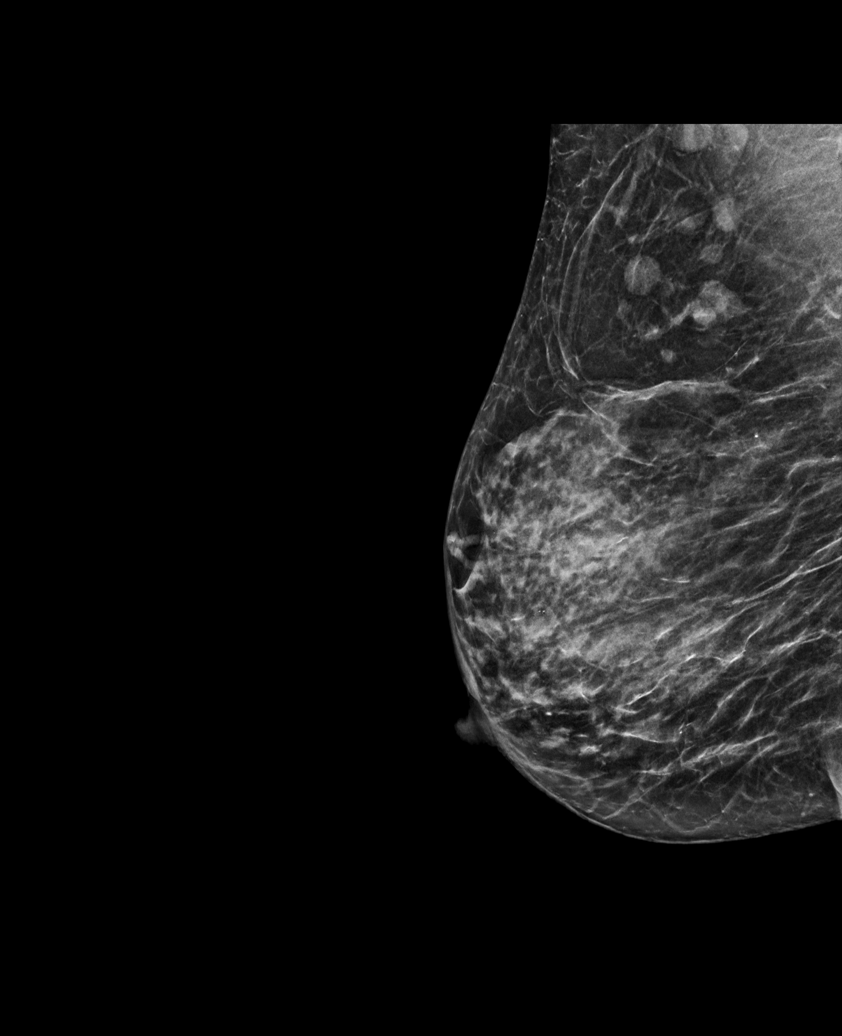

[R CC synth-2D]
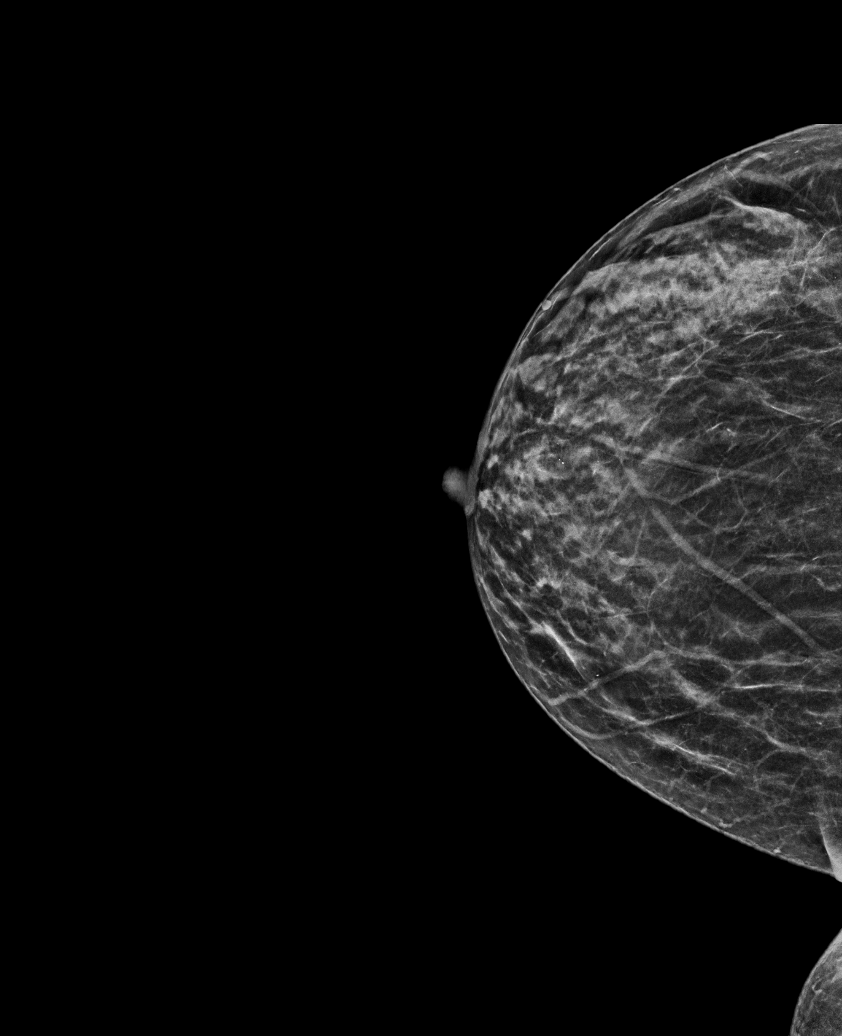

[L MLO synth-2D (1 of 2)]
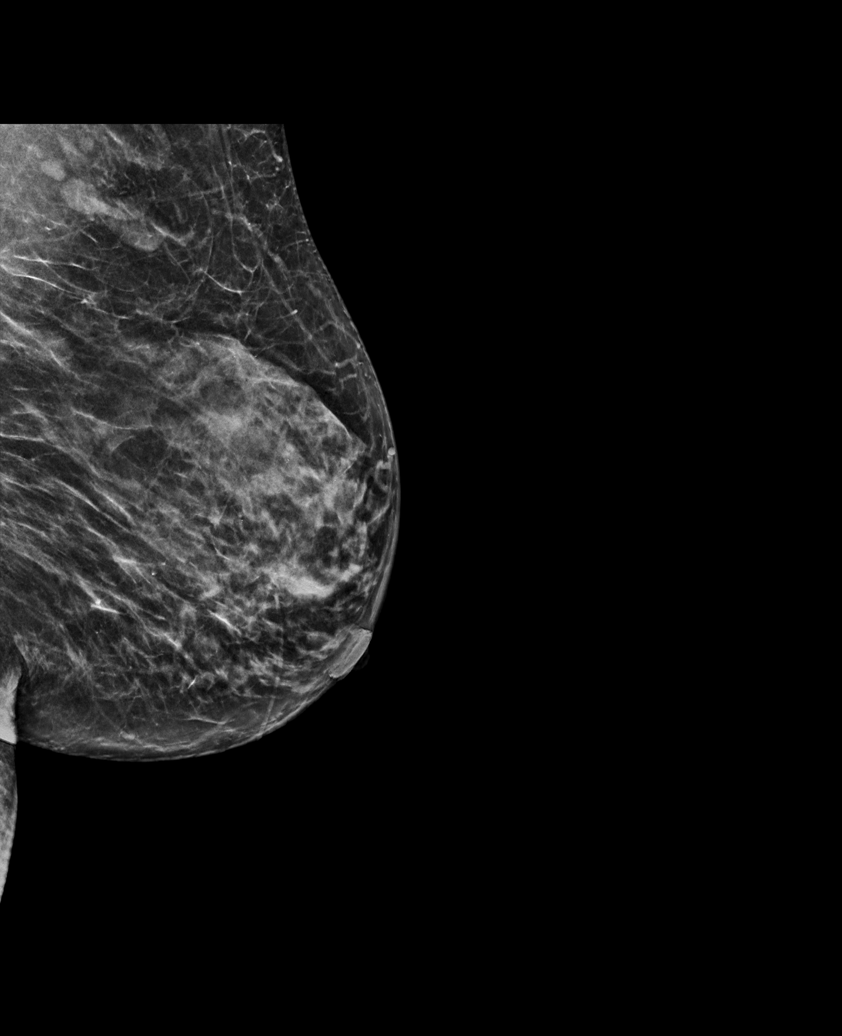

[L MLO synth-2D (2 of 2)]
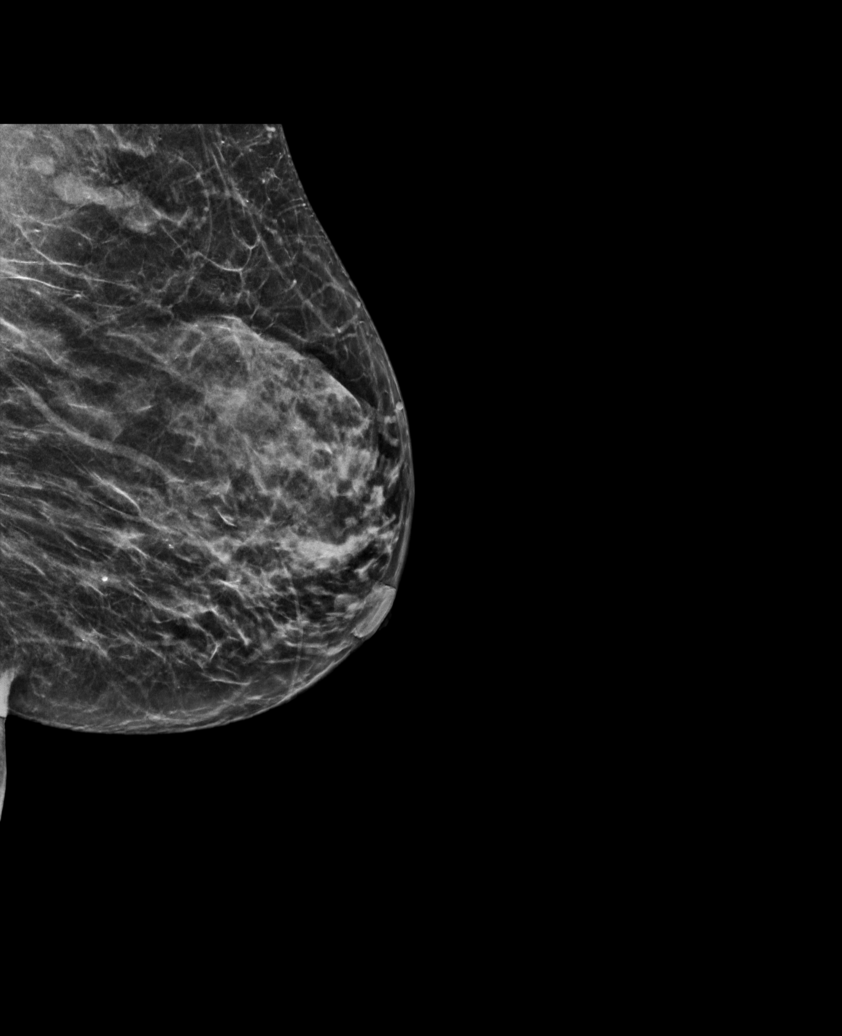

[R CC tomo · tomo slice 23/46.0]
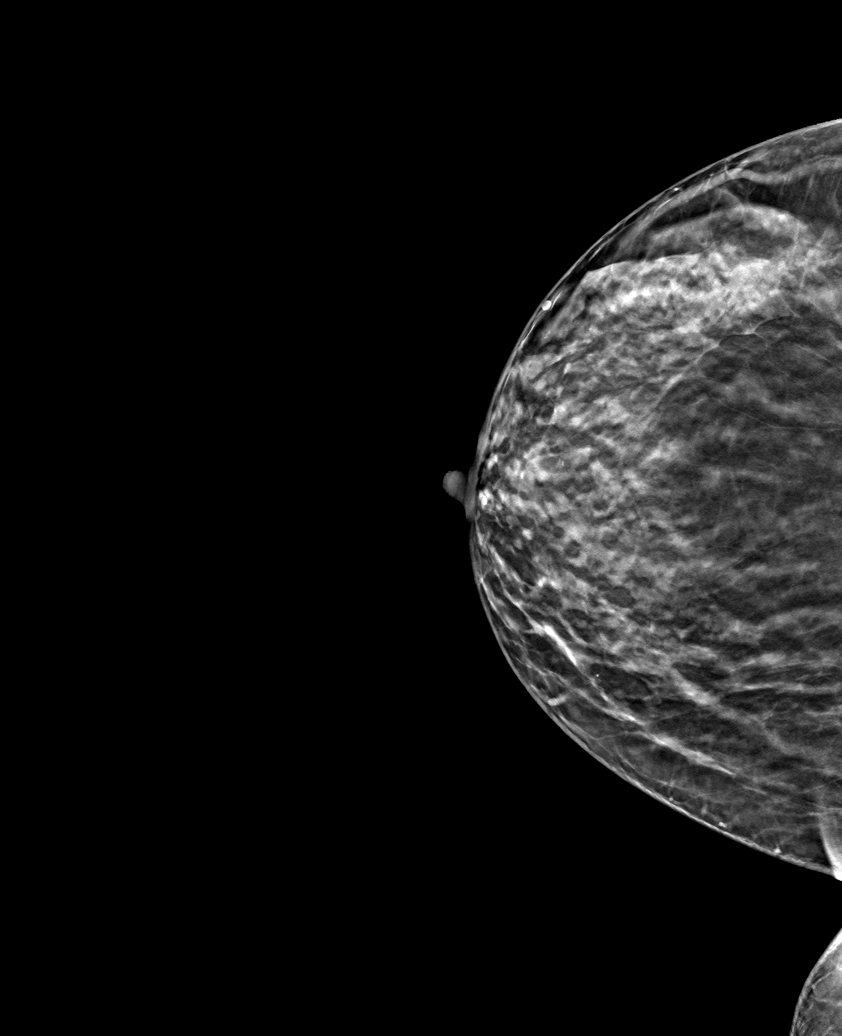

[6 of 30 positions shown; findings below may reference images not displayed]

ACR Breast Density Category c: The breast tissue is heterogeneously
dense, which may obscure small masses.
FINDINGS: There are no findings suspicious for malignancy. Images were
processed with CAD.
IMPRESSION: No mammographic evidence of malignancy. A result letter of this
screening mammogram will be mailed directly to the patient.

RECOMMENDATION:
Screening mammogram in one year. (Code:[id number]-[id number])

BI-RADS CATEGORY  1: Negative.

## 2020-09-27 ENCOUNTER — Other Ambulatory Visit: Payer: Self-pay

## 2020-09-27 ENCOUNTER — Ambulatory Visit (INDEPENDENT_AMBULATORY_CARE_PROVIDER_SITE_OTHER): Payer: BC Managed Care – PPO | Admitting: Pharmacist

## 2020-09-27 VITALS — BP 152/94 | HR 70

## 2020-09-27 DIAGNOSIS — I10 Essential (primary) hypertension: Secondary | ICD-10-CM | POA: Diagnosis not present

## 2020-09-27 DIAGNOSIS — K625 Hemorrhage of anus and rectum: Secondary | ICD-10-CM | POA: Insufficient documentation

## 2020-09-27 DIAGNOSIS — R030 Elevated blood-pressure reading, without diagnosis of hypertension: Secondary | ICD-10-CM | POA: Diagnosis not present

## 2020-09-27 DIAGNOSIS — R141 Gas pain: Secondary | ICD-10-CM | POA: Insufficient documentation

## 2020-09-27 DIAGNOSIS — R142 Eructation: Secondary | ICD-10-CM | POA: Insufficient documentation

## 2020-09-27 DIAGNOSIS — Z800 Family history of malignant neoplasm of digestive organs: Secondary | ICD-10-CM | POA: Insufficient documentation

## 2020-09-27 DIAGNOSIS — R194 Change in bowel habit: Secondary | ICD-10-CM | POA: Insufficient documentation

## 2020-09-27 MED ORDER — AMLODIPINE BESYLATE 2.5 MG PO TABS: 2.5000 mg | ORAL_TABLET | Freq: Every day | ORAL | 2 refills | Status: DC

## 2020-09-27 NOTE — Patient Instructions (Addendum)
It was nice meeting you today  We would like your blood pressure to be less then <130/80  Continue your exercise routine and watch your salt intake  Continue monitoring your blood pressure at home.  We recommend the Omron series 3 or bronze upper arm cuffs  We will start a new medication called amlodipine 2.5mg .  You will take 1 tablet once a day.  Please call with any problems  We will see you back in 4 weeks  [name], PharmD, BCACP, CDCES, CPP [facility] [address], [place], [place] [address] Phone: [phone]; Fax: [phone] 09/27/2020 10:54 AM

## 2020-09-27 NOTE — Progress Notes (Signed)
Patient ID: Lauren Carroll                 DOB: 1971/06/13                      MRN: [id number]     HPI: Lauren Carroll is a 50 y.o. female referred by Dr. Duke Salvia to HTN clinic. PMH is significant for HTN, palpitations, and asthma.  Was seen by Dr Duke Salvia on 09/01/20 for assessment of a syncopal episode and palpitations.  It was noted she had elevated BP at the time and she was referred to HTN clinic.  Patient presents today for first visit to HTN clinic.  Reports she is peri menopausal and has been feeling poorly in the second half of the month with frequent headaches.  Believes her BP is also higher in the second half of the month.  Uses a wrist cuff and brought home BP log.  Reports she is very salt sensitive and small amounts of sodium impact her BP. Yesterday had fried chicken, macaroni and cheese, and yams and all were seasoned.  When she cooks at home she does not use as much salt.  Is still practicing intermittent fasting which started for religious reasons but is now to help with weight loss.  Exercises every morning.  Reports she had previously been on HCTZ and it was effective.  However she drives from [place] to [facility] every day and needed to rush to restroom. Would prefer not to be on any diuretics.  Current HTN meds: n/a Previously tried: Trimaterene/HCTZ 37.5/25  BP goal: <130/80  Home BP readings:   3/18: 126/84 3/17: 142/91 3/16: 136/95 3/15: 126/82 3/14: 142/88 3/13: 132/94 3/12: 144/91  Wt Readings from Last 3 Encounters:  08/30/20 170 lb 9.6 oz (77.4 kg)  06/25/20 176 lb 3.2 oz (79.9 kg)  06/11/20 171 lb 9.6 oz (77.8 kg)   BP Readings from Last 3 Encounters:  08/30/20 (!) 142/88  06/11/20 130/88  02/06/20 120/70   Pulse Readings from Last 3 Encounters:  08/30/20 (!) 57  06/11/20 66  02/06/20 68    Renal function: CrCl cannot be calculated (Patient's most recent lab result is older than the maximum 21 days allowed.).  Past Medical  History:  Diagnosis Date  . Allergic rhinitis   . Asthma   . Asthmatic bronchitis   . Elevated blood pressure reading 09/01/2020  . History of URI (upper respiratory infection)   . Overactive bladder   . Palpitations 09/01/2020  . Syncope and collapse 09/01/2020    Current Outpatient Medications on File Prior to Visit  Medication Sig Dispense Refill  . 5-Hydroxytryptophan (5-HTP) 50 MG CAPS Take 50 mg by mouth daily.    Marland Kitchen albuterol (PROVENTIL) (2.5 MG/3ML) 0.083% nebulizer solution Take 3 mLs (2.5 mg total) by nebulization every 4 (four) hours as needed for wheezing or shortness of breath. 75 mL 2  . albuterol (VENTOLIN HFA) 108 (90 Base) MCG/ACT inhaler INHALE 2 PUFFS BY MOUTH EVERY 6 HOURS AS NEEDED FOR WHEEZING FOR SHORTNESS OF BREATH 18 g 11  . cetirizine (ZYRTEC) 10 MG tablet Take 10 mg by mouth daily.    . fluticasone furoate-vilanterol (BREO ELLIPTA) 200-25 MCG/INH AEPB Inhale 1 puff into the lungs daily. 60 each 11  . montelukast (SINGULAIR) 10 MG tablet Take 1 tablet (10 mg total) by mouth daily. 90 tablet 2  . Multiple Vitamin (MULTIVITAMINS PO) Take 1 tablet by mouth once a day    .  Vitamin D, Cholecalciferol, 50 MCG (2000 UT) CAPS Take by mouth daily.     No current facility-administered medications on file prior to visit.    Allergies  Allergen Reactions  . Cephalosporins   . Penicillins   . Sulfa Drugs Cross Reactors     itching     Assessment/Plan:  1. Hypertension - Patient BP in room 152/94 which is above goal of <130/80.  Recommended patient purchase Omron BP cuff rather than a wrist cuff for more accurate home BP readings.  Since patient is hesitant to restart a diuretic and very concerned about side effects, will start low dose amlodipine and counseled patient to watch for swelling.  Recheck in clinic in 4 weeks.  Start amlodipine 2.5mg  once daily Recheck in 4 weeks  Laural Carroll, PharmD, BCACP, CDCES, CPP [facility] [address], [place], [place] [address] Phone: [phone]; Fax: [phone] 09/27/2020 5:16 PM

## 2020-10-28 ENCOUNTER — Other Ambulatory Visit: Payer: Self-pay | Admitting: Internal Medicine

## 2020-10-28 DIAGNOSIS — Z1231 Encounter for screening mammogram for malignant neoplasm of breast: Secondary | ICD-10-CM

## 2020-10-31 ENCOUNTER — Other Ambulatory Visit: Payer: Self-pay

## 2020-10-31 ENCOUNTER — Ambulatory Visit (INDEPENDENT_AMBULATORY_CARE_PROVIDER_SITE_OTHER): Payer: BC Managed Care – PPO | Admitting: Pharmacist Clinician (PhC)/ Clinical Pharmacy Specialist

## 2020-10-31 DIAGNOSIS — R030 Elevated blood-pressure reading, without diagnosis of hypertension: Secondary | ICD-10-CM

## 2020-10-31 DIAGNOSIS — I10 Essential (primary) hypertension: Secondary | ICD-10-CM | POA: Diagnosis not present

## 2020-10-31 MED ORDER — AMLODIPINE BESYLATE 2.5 MG PO TABS: 2.5000 mg | ORAL_TABLET | Freq: Every day | ORAL | 3 refills | Status: DC

## 2020-10-31 NOTE — Progress Notes (Signed)
10/31/2020 Lauren Carroll [birth year] [id number]   HPI:  Lauren Carroll is a 50 y.o. female patient of Dr Duke Salvia, who presents today for hypertension clinic evaluation. Her PMH is significant for asthma on albuterol and fluticasone-vilanterol (Breo-Ellipta). At her last office visit with [name], PharmD her BP was 152/94, which is above goal of <130/80. She was started on Amlodipine  2.5 mg once daily.  Pt has follow-up visit with HTN clinic for management and counseling of Hypertension. She reports taking medications as prescribed, however due to daughter upcoming graduation from high school, lifestyle modifications and exercise has not been consistent.   Blood Pressure Goal:  <130/80 per ACC/AHA guidelines  Current Medications: Amlodipine 2.5 mg once daily   Family Hx: Mom - HTN, Dad - HTN, Open heart surgery due to blocked arteries, sister - HTN, brother - HTN  Social Hx: denies smoking and no alcohol consumption.   Diet: pt reports she is salt sensitive. The past month she has consumed more sweets and fast food than normal. She intermittently fasts regularly 16-20 hours per week and 12-14 hours on the weekend. Her diet consists of chicken or pork, rarely eats beef because it irritates her stomach. Consumes green beans (italian, whole, french) cut and corn. Does not consume a lot of fruit, but will eat about 15 grapes per serving. While eating out for dinner the last month, [employer] supreme meal has been a frequent option.   Exercise: typically runs daily in mornings around 2-4 miles. Occasionally will miss about 3 days a week.   Home BP readings: provided 2 readings at visit: 124/80 and 134/80. Pt monitors BP at home with wrist monitor.   Intolerances: hesitant to start diuretics and very concerned about side effects.   Labs:  06-23-20:  Na - 139, K - 4.0, Glu - 146, BUN - 12, Scr- 0.94, GFR - 81   Wt Readings from Last 3 Encounters:  10/31/20 180 lb (81.6 kg)   08/30/20 170 lb 9.6 oz (77.4 kg)  06/25/20 176 lb 3.2 oz (79.9 kg)   BP Readings from Last 3 Encounters:  10/31/20 (!) 142/88  09/27/20 (!) 152/94  08/30/20 (!) 142/88   Pulse Readings from Last 3 Encounters:  10/31/20 65  09/27/20 70  08/30/20 (!) 57    Current Outpatient Medications  Medication Sig Dispense Refill  . 5-Hydroxytryptophan (5-HTP) 50 MG CAPS Take 50 mg by mouth daily.    Marland Kitchen albuterol (PROVENTIL) (2.5 MG/3ML) 0.083% nebulizer solution Take 3 mLs (2.5 mg total) by nebulization every 4 (four) hours as needed for wheezing or shortness of breath. 75 mL 2  . albuterol (VENTOLIN HFA) 108 (90 Base) MCG/ACT inhaler INHALE 2 PUFFS BY MOUTH EVERY 6 HOURS AS NEEDED FOR WHEEZING FOR SHORTNESS OF BREATH 18 g 11  . fluticasone furoate-vilanterol (BREO ELLIPTA) 200-25 MCG/INH AEPB Inhale 1 puff into the lungs daily. 60 each 11  . Melatonin 5 MG CAPS Take 1 capsule by mouth.    . meloxicam (MOBIC) 15 MG tablet Take 15 mg by mouth daily. Uses PRN    . montelukast (SINGULAIR) 10 MG tablet Take 1 tablet (10 mg total) by mouth daily. 90 tablet 2  . Multiple Vitamin (MULTIVITAMINS PO) Take 1 tablet by mouth once a day    . Vitamin D, Cholecalciferol, 50 MCG (2000 UT) CAPS Take by mouth daily.    Marland Kitchen amLODipine (NORVASC) 2.5 MG tablet Take 1 tablet (2.5 mg total) by mouth daily. 90 tablet  3  . cetirizine (ZYRTEC) 10 MG tablet Take 10 mg by mouth daily. (Patient not taking: Reported on 10/31/2020)     No current facility-administered medications for this visit.    Allergies  Allergen Reactions  . Cephalosporins   . Penicillins   . Sulfa Drugs Cross Reactors     itching    Past Medical History:  Diagnosis Date  . Allergic rhinitis   . Asthma   . Asthmatic bronchitis   . Elevated blood pressure reading 09/01/2020  . History of URI (upper respiratory infection)   . Overactive bladder   . Palpitations 09/01/2020  . Syncope and collapse 09/01/2020    Blood pressure (!) 142/88, pulse  65, resp. rate 16, height 5\' 1"  (1.549 m), weight 180 lb (81.6 kg), last menstrual period 08/27/2020, SpO2 98 %.  Benign hypertension Today's blood pressure of 142/88 shows a blood pressure above goal. Per ACC/AHA guidelines, the patient's goal blood pressure should be <130/80. Pt reports she did not take her medication this morning because she needs a refill. Pt denies adverse effects to her current medication regimen.   BP technique was assessed and properly demonstrated to patient in office. Pt was provided with BP tracking log forms to record BP daily at home and bring to next office visit. Pt was reminded that if blood pressure is trending higher than goal to document meal for past 24 hours.   Continue amlodipine 2.5 mg daily while monitoring BP at home once daily. Follow-up appt in 6 weeks.  08/29/2020, PharmD candidate 2022 [facility]  I was with student and patient throughout visit and agree with the above assessment and plan.  [facility] PharmD CPP [facility] [address] [place], [place] [id number]

## 2020-10-31 NOTE — Patient Instructions (Signed)
Return for a a follow up appointment June 16 at 8:30 am  Check your blood pressure at home most days of the week and keep record of the readings. Keep note of when you have puffiness or edema on your blood pressure log  Take your BP meds as follows:  Continue with amlodipine 2.5 mg daily  Bring all of your meds, your BP cuff and your record of home blood pressures to your next appointment.  Exercise as you're able, try to walk approximately 30 minutes per day.  Keep salt intake to a minimum, especially watch canned and prepared boxed foods.  Eat more fresh fruits and vegetables and fewer canned items.  Avoid eating in fast food restaurants.    HOW TO TAKE YOUR BLOOD PRESSURE: . Rest 5 minutes before taking your blood pressure. .  Don't smoke or drink caffeinated beverages for at least 30 minutes before. . Take your blood pressure before (not after) you eat. . Sit comfortably with your back supported and both feet on the floor (don't cross your legs). . Elevate your arm to heart level on a table or a desk. . Use the proper sized cuff. It should fit smoothly and snugly around your bare upper arm. There should be enough room to slip a fingertip under the cuff. The bottom edge of the cuff should be 1 inch above the crease of the elbow. . Ideally, take 3 measurements at one sitting and record the average.

## 2020-10-31 NOTE — Assessment & Plan Note (Addendum)
Today's blood pressure of 142/88 shows a blood pressure above goal. Per ACC/AHA guidelines, the patient's goal blood pressure should be <130/80. Pt reports she did not take her medication this morning because she needs a refill. Pt denies adverse effects to her current medication regimen.   BP technique was assessed and properly demonstrated to patient in office. Pt was provided with BP tracking log forms to record BP daily at home and bring to next office visit. Pt was reminded that if blood pressure is trending higher than goal to document meal for past 24 hours.   Continue amlodipine 2.5 mg daily while monitoring BP at home once daily. Follow-up appt in 6 weeks.

## 2020-12-18 ENCOUNTER — Other Ambulatory Visit: Payer: Self-pay

## 2020-12-18 ENCOUNTER — Ambulatory Visit
Admission: RE | Admit: 2020-12-18 | Discharge: 2020-12-18 | Disposition: A | Discharge status: Home / Self Care | Payer: BC Managed Care – PPO | Source: Ambulatory Visit

## 2020-12-18 DIAGNOSIS — Z1231 Encounter for screening mammogram for malignant neoplasm of breast: Secondary | ICD-10-CM

## 2020-12-19 ENCOUNTER — Other Ambulatory Visit: Payer: Self-pay | Admitting: Internal Medicine

## 2020-12-26 ENCOUNTER — Ambulatory Visit (INDEPENDENT_AMBULATORY_CARE_PROVIDER_SITE_OTHER): Payer: BC Managed Care – PPO | Admitting: Pharmacist Clinician (PhC)/ Clinical Pharmacy Specialist

## 2020-12-26 ENCOUNTER — Other Ambulatory Visit: Payer: Self-pay

## 2020-12-26 DIAGNOSIS — I10 Essential (primary) hypertension: Secondary | ICD-10-CM | POA: Diagnosis not present

## 2020-12-26 NOTE — Patient Instructions (Signed)
Return for a a follow up appointment with Dr. Duke Salvia December 15 at 9 am at [facility] office  Check your blood pressure at home daily 2-3 days each week and keep record of the readings.  Take your BP meds as follows:  Continue with amlodipine 2.5 mg daily  Bring all of your meds, your BP cuff and your record of home blood pressures to your next appointment.  Exercise as you're able, try to walk approximately 30 minutes per day.  Keep salt intake to a minimum, especially watch canned and prepared boxed foods.  Eat more fresh fruits and vegetables and fewer canned items.  Avoid eating in fast food restaurants.    HOW TO TAKE YOUR BLOOD PRESSURE: Rest 5 minutes before taking your blood pressure.  Don't smoke or drink caffeinated beverages for at least 30 minutes before. Take your blood pressure before (not after) you eat. Sit comfortably with your back supported and both feet on the floor (don't cross your legs). Elevate your arm to heart level on a table or a desk. Use the proper sized cuff. It should fit smoothly and snugly around your bare upper arm. There should be enough room to slip a fingertip under the cuff. The bottom edge of the cuff should be 1 inch above the crease of the elbow. Ideally, take 3 measurements at one sitting and record the average.

## 2020-12-26 NOTE — Assessment & Plan Note (Signed)
Patient with essential hypertension, doing well with her lifestyle modifications.  She can continue with the amlodipine 2.5 mg daily.  Since she wonders if she could d/c, I suggested that if her pressure goes < 110 she can hold one dose.  Watch pressure for several days and if no significant increases, can hold amlodipine for pressures < 110 systolic.  She will follow up with Dr. Duke Salvia in 6 months.

## 2020-12-26 NOTE — Progress Notes (Signed)
12/26/2020 VERSA CRATON 04/15/1971 [id number]   HPI:  Lauren Carroll is a 50 y.o. female patient of Dr Duke Salvia, who presents today for hypertension clinic evaluation. Her PMH is significant for asthma on albuterol and fluticasone-vilanterol (Breo-Ellipta). She has been seen in [facility] clinic multiple times now, with the last visit showing her to still be elevated at 142/88. At that time she asked if she could work on improving lifestyle modifications.    Today she returns for follow up.  States she has been feeling well, and has regular readings from the past 6 weeks.  Has tried to maintain a healthy lifestyle and is asking about potentially stopping BP medication.  She currently takes only amlodipine 2.5 mg daily.    Blood Pressure Goal:  <130/80 per ACC/AHA guidelines  Current Medications: Amlodipine 2.5 mg once daily   Family Hx: Mom - HTN, Dad - HTN, Open heart surgery due to blocked arteries, sister - HTN, brother - HTN  Social Hx: denies smoking and no alcohol consumption.   Diet: pt reports she is salt sensitive. The past month she has consumed more sweets and fast food than normal. She intermittently fasts regularly 16-20 hours per week and 12-14 hours on the weekend. Her diet consists of chicken or pork, rarely eats beef because it irritates her stomach. Consumes green beans (italian, whole, french) cut and corn. Does not consume a lot of fruit, but will eat about 15 grapes per serving.  Exercise: typically runs daily in mornings around 2-4 miles 3-4 times per week.   Home BP readings: has 44 readings over the past 6 weeks, average 123/81 with HR 64 (range 111-139/66-95, HR range 51-91)  Home cuff Equate wrist monitor, read within 5 points of office cuff this morning  Intolerances: hesitant to start diuretics and very concerned about side effects.   Labs:  06-23-20: Na - 139, K - 4.0, Glu - 146, BUN - 12, Scr- 0.94, GFR - 81   Wt Readings from Last 3 Encounters:   10/31/20 180 lb (81.6 kg)  08/30/20 170 lb 9.6 oz (77.4 kg)  06/25/20 176 lb 3.2 oz (79.9 kg)   BP Readings from Last 3 Encounters:  12/26/20 122/78  10/31/20 (!) 142/88  09/27/20 (!) 152/94   Pulse Readings from Last 3 Encounters:  12/26/20 62  10/31/20 65  09/27/20 70    Current Outpatient Medications  Medication Sig Dispense Refill   5-Hydroxytryptophan (5-HTP) 50 MG CAPS Take 50 mg by mouth daily. Takes on 15-30 of each month     albuterol (PROVENTIL) (2.5 MG/3ML) 0.083% nebulizer solution Take 3 mLs (2.5 mg total) by nebulization every 4 (four) hours as needed for wheezing or shortness of breath. 75 mL 2   albuterol (VENTOLIN HFA) 108 (90 Base) MCG/ACT inhaler INHALE 2 PUFFS BY MOUTH EVERY 6 HOURS AS NEEDED FOR WHEEZING FOR SHORTNESS OF BREATH 18 g 11   amLODipine (NORVASC) 2.5 MG tablet Take 1 tablet (2.5 mg total) by mouth daily. 90 tablet 3   cetirizine (ZYRTEC) 10 MG tablet Take 10 mg by mouth daily.     fluticasone furoate-vilanterol (BREO ELLIPTA) 200-25 MCG/INH AEPB Inhale 1 puff into the lungs daily. 60 each 11   Lactobacillus (PROBIOTIC ACIDOPHILUS PO) Take 1 capsule by mouth daily.     Melatonin 5 MG CAPS Take 1 capsule by mouth.     meloxicam (MOBIC) 15 MG tablet Take 15 mg by mouth daily. Uses PRN     montelukast (  SINGULAIR) 10 MG tablet Take 1 tablet by mouth once daily 90 tablet 0   Multiple Vitamin (MULTIVITAMINS PO) Take 1 tablet by mouth once a day     Vitamin D, Cholecalciferol, 50 MCG (2000 UT) CAPS Take by mouth daily.     No current facility-administered medications for this visit.    Allergies  Allergen Reactions   Cephalosporins    Penicillins    Sulfa Drugs Cross Reactors     itching    Past Medical History:  Diagnosis Date   Allergic rhinitis    Asthma    Asthmatic bronchitis    Elevated blood pressure reading 09/01/2020   History of URI (upper respiratory infection)    Overactive bladder    Palpitations 09/01/2020   Syncope and collapse  09/01/2020    Blood pressure 122/78, pulse 62.  Benign hypertension Patient with essential hypertension, doing well with her lifestyle modifications.  She can continue with the amlodipine 2.5 mg daily.  Since she wonders if she could d/c, I suggested that if her pressure goes < 110 she can hold one dose.  Watch pressure for several days and if no significant increases, can hold amlodipine for pressures < 110 systolic.  She will follow up with Dr. Duke Salvia in 6 months.     Phillips Hay PharmD CPP [facility] [address] [place], [place] [address] [phone]

## 2021-01-07 ENCOUNTER — Ambulatory Visit: Payer: BC Managed Care – PPO | Attending: Critical Care Medicine

## 2021-01-07 DIAGNOSIS — Z20822 Contact with and (suspected) exposure to covid-19: Secondary | ICD-10-CM

## 2021-01-08 LAB — SARS-COV-2, NAA 2 DAY TAT

## 2021-01-08 LAB — NOVEL CORONAVIRUS, NAA: SARS-CoV-2, NAA: NOT DETECTED

## 2021-01-24 ENCOUNTER — Other Ambulatory Visit: Payer: Self-pay | Admitting: Internal Medicine

## 2021-01-29 ENCOUNTER — Other Ambulatory Visit: Payer: Self-pay

## 2021-01-29 MED ORDER — FLUTICASONE FUROATE-VILANTEROL 200-25 MCG/INH IN AEPB: INHALATION_SPRAY | RESPIRATORY_TRACT | 5 refills | Status: DC

## 2021-02-07 ENCOUNTER — Other Ambulatory Visit: Payer: Self-pay | Admitting: Internal Medicine

## 2021-02-11 ENCOUNTER — Other Ambulatory Visit: Payer: Self-pay

## 2021-02-11 ENCOUNTER — Encounter: Payer: Self-pay | Admitting: Internal Medicine

## 2021-02-11 ENCOUNTER — Ambulatory Visit (INDEPENDENT_AMBULATORY_CARE_PROVIDER_SITE_OTHER): Payer: BC Managed Care – PPO | Admitting: Internal Medicine

## 2021-02-11 VITALS — BP 122/68 | HR 82 | Temp 97.6°F | Ht 59.4 in | Wt 181.6 lb

## 2021-02-11 DIAGNOSIS — I10 Essential (primary) hypertension: Secondary | ICD-10-CM | POA: Diagnosis not present

## 2021-02-11 DIAGNOSIS — R0789 Other chest pain: Secondary | ICD-10-CM | POA: Diagnosis not present

## 2021-02-11 DIAGNOSIS — Z6836 Body mass index (BMI) 36.0-36.9, adult: Secondary | ICD-10-CM

## 2021-02-11 DIAGNOSIS — E559 Vitamin D deficiency, unspecified: Secondary | ICD-10-CM

## 2021-02-11 DIAGNOSIS — E6609 Other obesity due to excess calories: Secondary | ICD-10-CM | POA: Diagnosis not present

## 2021-02-11 DIAGNOSIS — Z0000 Encounter for general adult medical examination without abnormal findings: Secondary | ICD-10-CM

## 2021-02-11 LAB — CBC
Hematocrit: 41.4 % (ref 34.0–46.6)
Hemoglobin: 13.3 g/dL (ref 11.1–15.9)
MCH: 29.8 pg (ref 26.6–33.0)
MCHC: 32.1 g/dL (ref 31.5–35.7)
MCV: 93 fL (ref 79–97)
Platelets: 294 10*3/uL (ref 150–450)
RBC: 4.46 x10E6/uL (ref 3.77–5.28)
RDW: 12.1 % (ref 11.7–15.4)
WBC: 7.5 10*3/uL (ref 3.4–10.8)

## 2021-02-11 LAB — CMP14+EGFR
ALT: 33 IU/L — ABNORMAL HIGH (ref 0–32)
AST: 33 IU/L (ref 0–40)
Albumin/Globulin Ratio: 1.6 (ref 1.2–2.2)
Albumin: 4.6 g/dL (ref 3.8–4.9)
Alkaline Phosphatase: 82 IU/L (ref 44–121)
BUN/Creatinine Ratio: 17 (ref 9–23)
BUN: 16 mg/dL (ref 6–24)
Bilirubin Total: 0.8 mg/dL (ref 0.0–1.2)
CO2: 24 mmol/L (ref 20–29)
Calcium: 9.6 mg/dL (ref 8.7–10.2)
Chloride: 101 mmol/L (ref 96–106)
Creatinine, Ser: 0.94 mg/dL (ref 0.57–1.00)
Globulin, Total: 2.9 g/dL (ref 1.5–4.5)
Glucose: 89 mg/dL (ref 65–99)
Potassium: 4.4 mmol/L (ref 3.5–5.2)
Sodium: 139 mmol/L (ref 134–144)
Total Protein: 7.5 g/dL (ref 6.0–8.5)
eGFR: 73 mL/min/{1.73_m2} (ref >59)

## 2021-02-11 LAB — LIPID PANEL
Chol/HDL Ratio: 2.4 ratio (ref 0.0–4.4)
Cholesterol, Total: 224 mg/dL — ABNORMAL HIGH (ref 100–199)
HDL: 94 mg/dL (ref >39)
LDL Chol Calc (NIH): 121 mg/dL — ABNORMAL HIGH (ref 0–99)
Triglycerides: 52 mg/dL (ref 0–149)
VLDL Cholesterol Cal: 9 mg/dL (ref 5–40)

## 2021-02-11 LAB — HEMOGLOBIN A1C
Est. average glucose Bld gHb Est-mCnc: 120 mg/dL
Hgb A1c MFr Bld: 5.8 % — ABNORMAL HIGH (ref 4.8–5.6)

## 2021-02-11 MED ORDER — ALBUTEROL SULFATE (2.5 MG/3ML) 0.083% IN NEBU: 2.5000 mg | INHALATION_SOLUTION | RESPIRATORY_TRACT | 11 refills | Status: DC | PRN

## 2021-02-11 MED ORDER — MONTELUKAST SODIUM 10 MG PO TABS: 10.0000 mg | ORAL_TABLET | Freq: Every day | ORAL | 2 refills | Status: DC

## 2021-02-11 MED ORDER — MELOXICAM 15 MG PO TABS: 15.0000 mg | ORAL_TABLET | Freq: Every day | ORAL | 3 refills | Status: DC

## 2021-02-11 MED ORDER — FAMOTIDINE 20 MG PO TABS: 20.0000 mg | ORAL_TABLET | Freq: Two times a day (BID) | ORAL | 1 refills | Status: DC

## 2021-02-11 NOTE — Progress Notes (Signed)
I,Tianna Badgett,acting as a Education administrator for Maximino Greenland, MD.,have documented all relevant documentation on the behalf of Maximino Greenland, MD,as directed by  Maximino Greenland, MD while in the presence of Maximino Greenland, MD.  This visit occurred during the SARS-CoV-2 public health emergency.  Safety protocols were in place, including screening questions prior to the visit, additional usage of staff PPE, and extensive cleaning of exam room while observing appropriate contact time as indicated for disinfecting solutions.  Subjective:     Patient ID: Lauren Carroll , female    DOB: August 31, 1970 , 50 y.o.   MRN: [id number]   Chief Complaint  Patient presents with   Annual Exam   Hypertension    HPI  She is here today for a full physical examination. She is followed by Dr. Garwin Brothers for her pelvic examinations. She has been started on BP meds by her cardiologist, Dr. Oval Linsey. Now also followed by Pharmacy team.   Hypertension This is a chronic problem. The current episode started more than 1 month ago. The problem has been gradually improving since onset. The problem is controlled. Associated symptoms include PND. Pertinent negatives include no blurred vision, neck pain or orthopnea. Risk factors for coronary artery disease include family history and obesity. Past treatments include calcium channel blockers. The current treatment provides moderate improvement. There are no compliance problems.     Past Medical History:  Diagnosis Date   Allergic rhinitis    Asthma    Asthmatic bronchitis    Elevated blood pressure reading 09/01/2020   History of URI (upper respiratory infection)    Overactive bladder    Palpitations 09/01/2020   Syncope and collapse 09/01/2020     Family History  Problem Relation Age of Onset   Hypertension Mother    Diabetes Father    Hypertension Father    Coronary artery disease Father    Heart attack Father    Hypertension Sister    Hypertension Brother     Transient ischemic attack Maternal Grandmother      Current Outpatient Medications:    5-Hydroxytryptophan (5-HTP) 50 MG CAPS, Take 50 mg by mouth daily. Takes on 15-30 of each month, Disp: , Rfl:    amLODipine (NORVASC) 2.5 MG tablet, Take 1 tablet (2.5 mg total) by mouth daily., Disp: 90 tablet, Rfl: 3   cetirizine (ZYRTEC) 10 MG tablet, Take 10 mg by mouth daily., Disp: , Rfl:    famotidine (PEPCID) 20 MG tablet, Take 1 tablet (20 mg total) by mouth 2 (two) times daily., Disp: 60 tablet, Rfl: 1   fluticasone furoate-vilanterol (BREO ELLIPTA) 200-25 MCG/INH AEPB, Inhale 1 puff by mouth once daily, Disp: 60 each, Rfl: 5   Lactobacillus (PROBIOTIC ACIDOPHILUS PO), Take 1 capsule by mouth daily., Disp: , Rfl:    Melatonin 5 MG CAPS, Take 1 capsule by mouth., Disp: , Rfl:    Multiple Vitamin (MULTIVITAMINS PO), Take 1 tablet by mouth once a day, Disp: , Rfl:    Vitamin D, Cholecalciferol, 50 MCG (2000 UT) CAPS, Take by mouth daily., Disp: , Rfl:    albuterol (PROVENTIL) (2.5 MG/3ML) 0.083% nebulizer solution, Take 3 mLs (2.5 mg total) by nebulization every 4 (four) hours as needed for wheezing or shortness of breath., Disp: 75 mL, Rfl: 11   albuterol (VENTOLIN HFA) 108 (90 Base) MCG/ACT inhaler, INHALE 2 PUFFS BY MOUTH EVERY 6 HOURS AS NEEDED FOR WHEEZING FOR SHORTNESS OF BREATH, Disp: 18 g, Rfl: 0   meloxicam (MOBIC) 15  MG tablet, Take 1 tablet (15 mg total) by mouth daily. Uses PRN, Disp: 30 tablet, Rfl: 3   montelukast (SINGULAIR) 10 MG tablet, Take 1 tablet (10 mg total) by mouth daily., Disp: 90 tablet, Rfl: 2   Allergies  Allergen Reactions   Cephalosporins    Penicillins    Sulfa Drugs Cross Reactors     itching      The patient states she uses none for birth control. Last LMP was No LMP recorded.. Negative for Dysmenorrhea. Negative for: breast discharge, breast lump(s), breast pain and breast self exam. Associated symptoms include abnormal vaginal bleeding. Pertinent negatives  include abnormal bleeding (hematology), anxiety, decreased libido, depression, difficulty falling sleep, dyspareunia, history of infertility, nocturia, sexual dysfunction, sleep disturbances, urinary incontinence, urinary urgency, vaginal discharge and vaginal itching. Diet regular.The patient states her exercise level is    . The patient's tobacco use is:  Social History   Tobacco Use  Smoking Status Never  Smokeless Tobacco Never  . She has been exposed to passive smoke. The patient's alcohol use is:  Social History   Substance and Sexual Activity  Alcohol Use No    Review of Systems  Constitutional: Negative.   HENT: Negative.    Eyes: Negative.  Negative for blurred vision.  Respiratory:  Positive for chest tightness.        She c/o chest tightness/discomfort.  Does not affect her breathing. Thinks it could be due to reflux. Denies indigestion and heartburn. Took a Tums w/ relief. No associated diaphoresis, palpitations. Has already been evaluated by Cardiology for palpitations and syncopal episode.   Cardiovascular:  Positive for PND. Negative for orthopnea.  Gastrointestinal: Negative.   Endocrine: Negative.   Genitourinary: Negative.   Musculoskeletal: Negative.  Negative for neck pain.  Skin: Negative.   Allergic/Immunologic: Negative.   Neurological: Negative.   Hematological: Negative.   Psychiatric/Behavioral: Negative.      Today's Vitals   02/11/21 0931  BP: 122/68  Pulse: 82  Temp: 97.6 F (36.4 C)  TempSrc: Oral  Weight: 181 lb 9.6 oz (82.4 kg)  Height: 4' 11.4" (1.509 m)   Body mass index is 36.19 kg/m.  Wt Readings from Last 3 Encounters:  02/11/21 181 lb 9.6 oz (82.4 kg)  10/31/20 180 lb (81.6 kg)  08/30/20 170 lb 9.6 oz (77.4 kg)   BP Readings from Last 3 Encounters:  02/11/21 122/68  12/26/20 122/78  10/31/20 (!) 142/88     Objective:  Physical Exam Vitals and nursing note reviewed.  Constitutional:      Appearance: Normal appearance.   HENT:     Head: Normocephalic and atraumatic.     Right Ear: Tympanic membrane, ear canal and external ear normal.     Left Ear: Tympanic membrane, ear canal and external ear normal.     Nose:     Comments: Masked     Mouth/Throat:     Comments: Masked  Eyes:     Extraocular Movements: Extraocular movements intact.     Conjunctiva/sclera: Conjunctivae normal.     Pupils: Pupils are equal, round, and reactive to light.  Cardiovascular:     Rate and Rhythm: Normal rate and regular rhythm.     Pulses: Normal pulses.     Heart sounds: Normal heart sounds.  Pulmonary:     Effort: Pulmonary effort is normal.     Breath sounds: Normal breath sounds.  Chest:  Breasts:    Tanner Score is 5.     Right: Normal.  Left: Normal.  Abdominal:     General: Abdomen is flat. Bowel sounds are normal.     Palpations: Abdomen is soft.  Genitourinary:    Comments: deferred Musculoskeletal:        General: Normal range of motion.     Cervical back: Normal range of motion and neck supple.  Skin:    General: Skin is warm and dry.  Neurological:     General: No focal deficit present.     Mental Status: She is alert and oriented to person, place, and time.  Psychiatric:        Mood and Affect: Mood normal.        Behavior: Behavior normal.        Assessment And Plan:     1. Routine general medical examination at health care facility Comments: A full exam was performed. Importance of monthly self breast exams was discussed with the patient. She is up to date with mammogram/colonoscopy. PATIENT IS ADVISED TO GET 30-45 MINUTES REGULAR EXERCISE NO LESS THAN FOUR TO FIVE DAYS PER WEEK - BOTH WEIGHTBEARING EXERCISES AND AEROBIC ARE RECOMMENDED.  PATIENT IS ADVISED TO FOLLOW A HEALTHY DIET WITH AT LEAST SIX FRUITS/VEGGIES PER DAY, DECREASE INTAKE OF RED MEAT, AND TO INCREASE FISH INTAKE TO TWO DAYS PER WEEK.  MEATS/FISH SHOULD NOT BE FRIED, BAKED OR BROILED IS PREFERABLE.  IT IS ALSO IMPORTANT TO CUT  BACK ON YOUR SUGAR INTAKE. PLEASE AVOID ANYTHING WITH ADDED SUGAR, CORN SYRUP OR OTHER SWEETENERS. IF YOU MUST USE A SWEETENER, YOU CAN TRY STEVIA. IT IS ALSO IMPORTANT TO AVOID ARTIFICIALLY SWEETENERS AND DIET BEVERAGES. LASTLY, I SUGGEST WEARING SPF 50 SUNSCREEN ON EXPOSED PARTS AND ESPECIALLY WHEN IN THE DIRECT SUNLIGHT FOR AN EXTENDED PERIOD OF TIME.  PLEASE AVOID FAST FOOD RESTAURANTS AND INCREASE YOUR WATER INTAKE.  - CBC - Hemoglobin A1c - CMP14+EGFR - Lipid panel  2. Essential hypertension, benign Comments: Chronic, well controlled. EKG performed, NSR w/o acute changes. Encouraged to follow low sodium diet. She will f/u in six months for re-evaluation.  - EKG 12-Lead  3. Chest discomfort Her sx are suggestive of reflux. I will send rx famotidine to her pharmacy. Advised to take as prescribed. She will let me know if her sx persist. She is concerned b/c family history of CAD. If sx persist, will consider cardiac calcium scoring. Pt advised this is usually ordered by Cardiology.   4. Vitamin D deficiency Comments: I will check vitamin D level and supplement as needed.  5. Class 2 obesity due to excess calories without serious comorbidity with body mass index (BMI) of 36.0 to 36.9 in adult She is encouraged to strive for BMI less than 30 to decrease cardiac risk. Advised to aim for at least 150 minutes of exercise per week.   Patient was given opportunity to ask questions. Patient verbalized understanding of the plan and was able to repeat key elements of the plan. All questions were answered to their satisfaction.   I, Maximino Greenland, MD, have reviewed all documentation for this visit. The documentation on 02/11/21 for the exam, diagnosis, procedures, and orders are all accurate and complete.  THE PATIENT IS ENCOURAGED TO PRACTICE SOCIAL DISTANCING DUE TO THE COVID-19 PANDEMIC.

## 2021-02-11 NOTE — Patient Instructions (Addendum)
The 10-year ASCVD risk score Denman George DC Montez Hageman., et al., 2013) is: 1.7%   Values used to calculate the score:     Age: 50 years     Sex: Female     Is Non-Hispanic African American: Yes     Diabetic: No     Tobacco smoker: No     Systolic Blood Pressure: 122 mmHg     Is BP treated: Yes     HDL Cholesterol: 86 mg/dL     Total Cholesterol: 200 mg/dL    Health Maintenance, Female Adopting a healthy lifestyle and getting preventive care are important in promoting health and wellness. Ask your health care provider about: The right schedule for you to have regular tests and exams. Things you can do on your own to prevent diseases and keep yourself healthy. What should I know about diet, weight, and exercise? Eat a healthy diet  Eat a diet that includes plenty of vegetables, fruits, low-fat dairy products, and lean protein. Do not eat a lot of foods that are high in solid fats, added sugars, or sodium.  Maintain a healthy weight Body mass index (BMI) is used to identify weight problems. It estimates body fat based on height and weight. Your health care provider can help determineyour BMI and help you achieve or maintain a healthy weight. Get regular exercise Get regular exercise. This is one of the most important things you can do for your health. Most adults should: Exercise for at least 150 minutes each week. The exercise should increase your heart rate and make you sweat (moderate-intensity exercise). Do strengthening exercises at least twice a week. This is in addition to the moderate-intensity exercise. Spend less time sitting. Even light physical activity can be beneficial. Watch cholesterol and blood lipids Have your blood tested for lipids and cholesterol at 52 years of age, then havethis test every 5 years. Have your cholesterol levels checked more often if: Your lipid or cholesterol levels are high. You are older than 53 years of age. You are at high risk for heart disease. What  should I know about cancer screening? Depending on your health history and family history, you may need to have cancer screening at various ages. This may include screening for: Breast cancer. Cervical cancer. Colorectal cancer. Skin cancer. Lung cancer. What should I know about heart disease, diabetes, and high blood pressure? Blood pressure and heart disease High blood pressure causes heart disease and increases the risk of stroke. This is more likely to develop in people who have high blood pressure readings, are of African descent, or are overweight. Have your blood pressure checked: Every 3-5 years if you are 46-62 years of age. Every year if you are 83 years old or older. Diabetes Have regular diabetes screenings. This checks your fasting blood sugar level. Have the screening done: Once every three years after age 32 if you are at a normal weight and have a low risk for diabetes. More often and at a younger age if you are overweight or have a high risk for diabetes. What should I know about preventing infection? Hepatitis B If you have a higher risk for hepatitis B, you should be screened for this virus. Talk with your health care provider to find out if you are at risk forhepatitis B infection. Hepatitis C Testing is recommended for: Everyone born from [birth year] through 1967. Anyone with known risk factors for hepatitis C. Sexually transmitted infections (STIs) Get screened for STIs, including gonorrhea and chlamydia, if:  You are sexually active and are younger than 52 years of age. You are older than 52 years of age and your health care provider tells you that you are at risk for this type of infection. Your sexual activity has changed since you were last screened, and you are at increased risk for chlamydia or gonorrhea. Ask your health care provider if you are at risk. Ask your health care provider about whether you are at high risk for HIV. Your health care provider may recommend a  prescription medicine to help prevent HIV infection. If you choose to take medicine to prevent HIV, you should first get tested for HIV. You should then be tested every 3 months for as long as you are taking the medicine. Pregnancy If you are about to stop having your period (premenopausal) and you may become pregnant, seek counseling before you get pregnant. Take 400 to 800 micrograms (mcg) of folic acid every day if you become pregnant. Ask for birth control (contraception) if you want to prevent pregnancy. Osteoporosis and menopause Osteoporosis is a disease in which the bones lose minerals and strength with aging. This can result in bone fractures. If you are 41 years old or older, or if you are at risk for osteoporosis and fractures, ask your health care provider if you should: Be screened for bone loss. Take a calcium or vitamin D supplement to lower your risk of fractures. Be given hormone replacement therapy (HRT) to treat symptoms of menopause. Follow these instructions at home: Lifestyle Do not use any products that contain nicotine or tobacco, such as cigarettes, e-cigarettes, and chewing tobacco. If you need help quitting, ask your health care provider. Do not use street drugs. Do not share needles. Ask your health care provider for help if you need support or information about quitting drugs. Alcohol use Do not drink alcohol if: Your health care provider tells you not to drink. You are pregnant, may be pregnant, or are planning to become pregnant. If you drink alcohol: Limit how much you use to 0-1 drink a day. Limit intake if you are breastfeeding. Be aware of how much alcohol is in your drink. In the [place] one drink equals one 12 oz bottle of beer (355 mL), one 5 oz glass of wine (148 mL), or one 1 oz glass of hard liquor (44 mL). General instructions Schedule regular health, dental, and eye exams. Stay current with your vaccines. Tell your health care provider if: You  often feel depressed. You have ever been abused or do not feel safe at home. Summary Adopting a healthy lifestyle and getting preventive care are important in promoting health and wellness. Follow your health care provider's instructions about healthy diet, exercising, and getting tested or screened for diseases. Follow your health care provider's instructions on monitoring your cholesterol and blood pressure. This information is not intended to replace advice given to you by your health care provider. Make sure you discuss any questions you have with your healthcare provider. Document Revised: 06/22/2018 Document Reviewed: 06/22/2018 Elsevier Patient Education  2022 [employer].

## 2021-02-27 ENCOUNTER — Other Ambulatory Visit: Payer: Self-pay | Admitting: Internal Medicine

## 2021-04-03 ENCOUNTER — Other Ambulatory Visit: Payer: Self-pay

## 2021-04-03 MED ORDER — ALBUTEROL SULFATE HFA 108 (90 BASE) MCG/ACT IN AERS: INHALATION_SPRAY | RESPIRATORY_TRACT | 2 refills | Status: DC

## 2021-06-26 ENCOUNTER — Ambulatory Visit (HOSPITAL_BASED_OUTPATIENT_CLINIC_OR_DEPARTMENT_OTHER): Payer: BC Managed Care – PPO | Admitting: Cardiovascular Disease

## 2021-08-14 ENCOUNTER — Ambulatory Visit (INDEPENDENT_AMBULATORY_CARE_PROVIDER_SITE_OTHER): Payer: BC Managed Care – PPO | Admitting: Internal Medicine

## 2021-08-14 ENCOUNTER — Encounter: Payer: Self-pay | Admitting: Internal Medicine

## 2021-08-14 ENCOUNTER — Other Ambulatory Visit: Payer: Self-pay

## 2021-08-14 VITALS — BP 118/80 | HR 84 | Temp 97.7°F | Ht 59.4 in | Wt 183.4 lb

## 2021-08-14 DIAGNOSIS — E6609 Other obesity due to excess calories: Secondary | ICD-10-CM | POA: Diagnosis not present

## 2021-08-14 DIAGNOSIS — I10 Essential (primary) hypertension: Secondary | ICD-10-CM

## 2021-08-14 DIAGNOSIS — Z23 Encounter for immunization: Secondary | ICD-10-CM

## 2021-08-14 DIAGNOSIS — H6122 Impacted cerumen, left ear: Secondary | ICD-10-CM | POA: Diagnosis not present

## 2021-08-14 DIAGNOSIS — J4520 Mild intermittent asthma, uncomplicated: Secondary | ICD-10-CM

## 2021-08-14 DIAGNOSIS — Z6836 Body mass index (BMI) 36.0-36.9, adult: Secondary | ICD-10-CM

## 2021-08-14 NOTE — Patient Instructions (Signed)

## 2021-08-14 NOTE — Progress Notes (Signed)
°I,Jameka J Llittleton,acting as a scribe for Robyn N Sanders, MD.,have documented all relevant documentation on the behalf of Robyn N Sanders, MD,as directed by  Robyn N Sanders, MD while in the presence of Robyn N Sanders, MD.  °This visit occurred during the SARS-CoV-2 public health emergency.  Safety protocols were in place, including screening questions prior to the visit, additional usage of staff PPE, and extensive cleaning of exam room while observing appropriate contact time as indicated for disinfecting solutions. ° °Subjective:  °  ° Patient ID: Lauren Carroll , female    DOB: 12/10/1970 , 50 y.o.   MRN: [id number] ° ° °Chief Complaint  °Patient presents with  ° Hypertension  ° ° °HPI ° °Patient presents today for a 6 month HTN f/u. She reports compliance with meds. She denies headaches, chest pain and shortness of breath. However, she does report having some chest tightness. She is not sure if her sx are due to asthma or reflux. She has not been taking famotidine daily, only prn. She has yet to identify triggers for her sx. Denies fever/chills/cough. Reports compliance with inhaler. Has not had recent Pulmonary evaluation.  ° °Patient would like to have her ears checked, she feels her hearing is muffled.   ° °Hypertension °This is a chronic problem. The current episode started more than 1 year ago. The problem is unchanged. The problem is controlled.   ° °Past Medical History:  °Diagnosis Date  ° Allergic rhinitis   ° Asthma   ° Asthmatic bronchitis   ° Elevated blood pressure reading 09/01/2020  ° History of URI (upper respiratory infection)   ° Overactive bladder   ° Palpitations 09/01/2020  ° Syncope and collapse 09/01/2020  °  ° °Family History  °Problem Relation Age of Onset  ° Hypertension Mother   ° Diabetes Father   ° Hypertension Father   ° Coronary artery disease Father   ° Heart attack Father   ° Hypertension Sister   ° Hypertension Brother   ° Transient ischemic attack Maternal Grandmother    ° ° ° °Current Outpatient Medications:  °  albuterol (PROVENTIL) (2.5 MG/3ML) 0.083% nebulizer solution, Take 3 mLs (2.5 mg total) by nebulization every 4 (four) hours as needed for wheezing or shortness of breath., Disp: 75 mL, Rfl: 11 °  albuterol (VENTOLIN HFA) 108 (90 Base) MCG/ACT inhaler, INHALE 2 PUFFS BY MOUTH EVERY 6 HOURS AS NEEDED FOR WHEEZING FOR SHORTNESS OF BREATH, Disp: 3 each, Rfl: 2 °  amLODipine (NORVASC) 2.5 MG tablet, Take 1 tablet (2.5 mg total) by mouth daily., Disp: 90 tablet, Rfl: 3 °  cetirizine (ZYRTEC) 10 MG tablet, Take 10 mg by mouth daily., Disp: , Rfl:  °  famotidine (PEPCID) 20 MG tablet, Take 1 tablet (20 mg total) by mouth 2 (two) times daily., Disp: 60 tablet, Rfl: 1 °  fluticasone furoate-vilanterol (BREO ELLIPTA) 200-25 MCG/INH AEPB, Inhale 1 puff by mouth once daily, Disp: 60 each, Rfl: 5 °  Lactobacillus (PROBIOTIC ACIDOPHILUS PO), Take 1 capsule by mouth daily., Disp: , Rfl:  °  meloxicam (MOBIC) 15 MG tablet, Take 1 tablet (15 mg total) by mouth daily. Uses PRN, Disp: 30 tablet, Rfl: 3 °  montelukast (SINGULAIR) 10 MG tablet, Take 1 tablet (10 mg total) by mouth daily., Disp: 90 tablet, Rfl: 2 °  Multiple Vitamin (MULTIVITAMINS PO), Take 1 tablet by mouth once a day, Disp: , Rfl:  °  UNABLE TO FIND, Med Name: black koash takes 2 caps daily.,   daily., Disp: , Rfl:    Vitamin D, Cholecalciferol, 50 MCG (2000 UT) CAPS, Take by mouth daily., Disp: , Rfl:    Melatonin 5 MG CAPS, Take 1 capsule by mouth. (Patient not taking: Reported on 08/14/2021), Disp: , Rfl:    Allergies  Allergen Reactions   Cephalosporins    Penicillins    Sulfa Drugs Cross Reactors     itching     Review of Systems  Constitutional: Negative.   HENT:         She c/o muffled hearing. No ear pain.   Respiratory:  Positive for chest tightness.   Cardiovascular: Negative.   Neurological: Negative.   Psychiatric/Behavioral: Negative.      Today's Vitals   08/14/21 1006  BP: 118/80  Pulse: 84  Temp:  97.7 F (36.5 C)  Weight: 183 lb 6.4 oz (83.2 kg)  Height: 4' 11.4" (1.509 m)  PainSc: 0-No pain   Body mass index is 36.55 kg/m.  Wt Readings from Last 3 Encounters:  08/14/21 183 lb 6.4 oz (83.2 kg)  02/11/21 181 lb 9.6 oz (82.4 kg)  10/31/20 180 lb (81.6 kg)     Objective:  Physical Exam Vitals and nursing note reviewed.  Constitutional:      Appearance: Normal appearance.  HENT:     Head: Normocephalic and atraumatic.     Right Ear: Tympanic membrane, ear canal and external ear normal. There is no impacted cerumen.     Left Ear: Ear canal and external ear normal. There is impacted cerumen.     Nose:     Comments: Masked     Mouth/Throat:     Comments: Masked  Cardiovascular:     Rate and Rhythm: Normal rate and regular rhythm.     Heart sounds: Normal heart sounds.  Pulmonary:     Effort: Pulmonary effort is normal.     Breath sounds: Normal breath sounds.  Musculoskeletal:     Cervical back: Normal range of motion.  Skin:    General: Skin is warm.  Neurological:     General: No focal deficit present.     Mental Status: She is alert.  Psychiatric:        Mood and Affect: Mood normal.        Behavior: Behavior normal.        Assessment And Plan:     1. Essential hypertension, benign Comments: Chronic, well controlled. No med changes. She is advised to follow low sodium diet.  I will check renal function today.  - CMP14+EGFR - Hemoglobin A1c  2. Mild intermittent asthma without complication Comments: I will refer her to Pulmonary for further evaluation, will schedule PFTs as well. She is in agreement with treatment plan.  - Ambulatory referral to Pulmonology  3. Impacted cerumen of left ear AFTER OBTAINING VERBAL CONSENT, LEFT EAR WAS FLUSHED BY IRRIGATION. SHE TOLERATED PROCEDURE WELL WITHOUT ANY COMPLICATIONS. NO TM ABNORMALITIES WERE NOTED.  - Ear Lavage  4. Class 2 obesity due to excess calories without serious comorbidity with body mass index (BMI)  of 36.0 to 36.9 in adult Comments: She is encouraged to strive for BMI < 30 to decrease cardiac risk. She is encouraged to aim for at least 150 minutes of exercise per week.   5. Immunization due Comments: She was given Tdap to update her immunization history.  - Tdap vaccine greater than or equal to 7yo IM   Patient was given opportunity to ask questions. Patient verbalized understanding of the  and was able to repeat key elements of the plan. All questions were answered to their satisfaction.  ° °I, Robyn N Sanders, MD, have reviewed all documentation for this visit. The documentation on 08/14/21 for the exam, diagnosis, procedures, and orders are all accurate and complete.  ° °IF YOU HAVE BEEN REFERRED TO A SPECIALIST, IT MAY TAKE 1-2 WEEKS TO SCHEDULE/PROCESS THE REFERRAL. IF YOU HAVE NOT HEARD FROM US/SPECIALIST IN TWO WEEKS, PLEASE GIVE US A CALL AT [phone].  ° °THE PATIENT IS ENCOURAGED TO PRACTICE SOCIAL DISTANCING DUE TO THE COVID-19 PANDEMIC.   °

## 2021-08-15 LAB — CMP14+EGFR
ALT: 22 IU/L (ref 0–32)
AST: 28 IU/L (ref 0–40)
Albumin/Globulin Ratio: 1.5 (ref 1.2–2.2)
Albumin: 4.6 g/dL (ref 3.8–4.9)
Alkaline Phosphatase: 82 IU/L (ref 44–121)
BUN/Creatinine Ratio: 13 (ref 9–23)
BUN: 10 mg/dL (ref 6–24)
Bilirubin Total: 0.9 mg/dL (ref 0.0–1.2)
CO2: 24 mmol/L (ref 20–29)
Calcium: 9.6 mg/dL (ref 8.7–10.2)
Chloride: 100 mmol/L (ref 96–106)
Creatinine, Ser: 0.8 mg/dL (ref 0.57–1.00)
Globulin, Total: 3 g/dL (ref 1.5–4.5)
Glucose: 75 mg/dL (ref 70–99)
Potassium: 4.3 mmol/L (ref 3.5–5.2)
Sodium: 139 mmol/L (ref 134–144)
Total Protein: 7.6 g/dL (ref 6.0–8.5)
eGFR: 89 mL/min/{1.73_m2} (ref >59)

## 2021-08-15 LAB — HEMOGLOBIN A1C
Est. average glucose Bld gHb Est-mCnc: 120 mg/dL
Hgb A1c MFr Bld: 5.8 % — ABNORMAL HIGH (ref 4.8–5.6)

## 2021-08-25 ENCOUNTER — Encounter: Payer: Self-pay | Admitting: Internal Medicine

## 2021-09-23 ENCOUNTER — Encounter: Payer: Self-pay | Admitting: Pulmonary Disease

## 2021-09-23 ENCOUNTER — Other Ambulatory Visit: Payer: Self-pay

## 2021-09-23 ENCOUNTER — Ambulatory Visit: Payer: BC Managed Care – PPO | Admitting: Pulmonary Disease

## 2021-09-23 ENCOUNTER — Ambulatory Visit (INDEPENDENT_AMBULATORY_CARE_PROVIDER_SITE_OTHER): Payer: BC Managed Care – PPO

## 2021-09-23 VITALS — BP 126/74 | HR 86 | Temp 97.6°F | Ht 64.0 in | Wt 190.8 lb

## 2021-09-23 DIAGNOSIS — J4530 Mild persistent asthma, uncomplicated: Secondary | ICD-10-CM

## 2021-09-23 DIAGNOSIS — R0789 Other chest pain: Secondary | ICD-10-CM | POA: Diagnosis not present

## 2021-09-23 MED ORDER — SPIRIVA RESPIMAT 2.5 MCG/ACT IN AERS: 2.0000 | INHALATION_SPRAY | Freq: Every day | RESPIRATORY_TRACT | 0 refills | Status: DC

## 2021-09-23 NOTE — Progress Notes (Signed)
? ? ?Subjective:  ? ?PATIENT ID: Lauren Carroll GENDER: female DOB: 08-09-1970, MRN: [id number] ? ? ?HPI ? ?Chief Complaint  ?Patient presents with  ? Consult  ?  Hx of asthma, frequent chest congestion   ? ? ?Reason for Visit: New consult for asthma ? ?Lauren Carroll is a 51 year old female with never smoker with asthma, allergic rhinitis and HTN who presents as a new consult for asthma. ? ?Previously seen by Dr. Delford Field in 2014. She was previously on Flovent and Advair. Currently on Breo for the last year. She reports she is having frequent chest cold and chest pressure that has worsened in the last few months. It seems be cyclic occurring associated with her prior menses since her menopause started last year. She has had cardiac evaluation which was negative. She previously a runner 1-5 miles daily but has reduced to a mile daily due to palpitations. Work-up with cardiac monitor was negative.  She does report reflux symptoms. ? ?Social History: ?Never smoker ? ?I have personally reviewed patient's past medical/family/social history, allergies, current medications. ? ?Past Medical History:  ?Diagnosis Date  ? Allergic rhinitis   ? Asthma   ? Asthmatic bronchitis   ? Elevated blood pressure reading 09/01/2020  ? History of URI (upper respiratory infection)   ? Overactive bladder   ? Palpitations 09/01/2020  ? Syncope and collapse 09/01/2020  ?  ? ?Family History  ?Problem Relation Age of Onset  ? Hypertension Mother   ? Diabetes Father   ? Hypertension Father   ? Coronary artery disease Father   ? Heart attack Father   ? Hypertension Sister   ? Hypertension Brother   ? Transient ischemic attack Maternal Grandmother   ?  ? ?Social History  ? ?Occupational History  ? Occupation: Equities trader  ?  Employer: [employer]  ?Tobacco Use  ? Smoking status: Never  ? Smokeless tobacco: Never  ?Vaping Use  ? Vaping Use: Never used  ?Substance and Sexual Activity  ? Alcohol use: No  ? Drug use: No  ?  Sexual activity: Not on file  ? ? ?Allergies  ?Allergen Reactions  ? Cephalosporins   ? Penicillins   ? Sulfa Drugs Cross Reactors   ?  itching  ?  ? ?Outpatient Medications Prior to Visit  ?Medication Sig Dispense Refill  ? albuterol (PROVENTIL) (2.5 MG/3ML) 0.083% nebulizer solution Take 3 mLs (2.5 mg total) by nebulization every 4 (four) hours as needed for wheezing or shortness of breath. 75 mL 11  ? albuterol (VENTOLIN HFA) 108 (90 Base) MCG/ACT inhaler INHALE 2 PUFFS BY MOUTH EVERY 6 HOURS AS NEEDED FOR WHEEZING FOR SHORTNESS OF BREATH 3 each 2  ? amLODipine (NORVASC) 2.5 MG tablet Take 1 tablet (2.5 mg total) by mouth daily. 90 tablet 3  ? cetirizine (ZYRTEC) 10 MG tablet Take 10 mg by mouth daily.    ? famotidine (PEPCID) 20 MG tablet Take 1 tablet (20 mg total) by mouth 2 (two) times daily. 60 tablet 1  ? fluticasone furoate-vilanterol (BREO ELLIPTA) 200-25 MCG/INH AEPB Inhale 1 puff by mouth once daily 60 each 5  ? montelukast (SINGULAIR) 10 MG tablet Take 1 tablet (10 mg total) by mouth daily. 90 tablet 2  ? Multiple Vitamin (MULTIVITAMINS PO) Take 1 tablet by mouth once a day    ? UNABLE TO FIND Med Name: black Joanna Puff takes 2 caps daily.    ? Vitamin D, Cholecalciferol, 50 MCG (2000  UT) CAPS Take by mouth daily.    ? Lactobacillus (PROBIOTIC ACIDOPHILUS PO) Take 1 capsule by mouth daily. (Patient not taking: Reported on 09/23/2021)    ? Melatonin 5 MG CAPS Take 1 capsule by mouth. (Patient not taking: Reported on 09/23/2021)    ? meloxicam (MOBIC) 15 MG tablet Take 1 tablet (15 mg total) by mouth daily. Uses PRN (Patient not taking: Reported on 09/23/2021) 30 tablet 3  ? ?No facility-administered medications prior to visit.  ? ? ?Review of Systems  ?Constitutional:  Negative for chills, diaphoresis, fever, malaise/fatigue and weight loss.  ?HENT:  Negative for congestion.   ?Respiratory:  Negative for cough, hemoptysis, sputum production, shortness of breath and wheezing.   ?Cardiovascular:  Negative for  chest pain (chest pressure), palpitations and leg swelling.  ?Gastrointestinal:  Positive for heartburn.  ? ? ?Objective:  ? ?Vitals:  ? 09/23/21 1026  ?BP: 126/74  ?Pulse: 86  ?Temp: 97.6 ?F (36.4 ?C)  ?TempSrc: Oral  ?SpO2: 98%  ?Weight: 190 lb 12.8 oz (86.5 kg)  ?Height: 5\' 4"  (1.626 m)  ? ?SpO2: 98 % ?O2 Device: None (Room air) ? ?Physical Exam: ?General: Well-appearing, no acute distress ?HENT: [place], AT ?Eyes: EOMI, no scleral icterus ?Respiratory: Clear to auscultation bilaterally.  No crackles, wheezing or rales ?Cardiovascular: RRR, -M/R/G, no JVD ?Extremities:-Edema,-tenderness ?Neuro: AAO x4, CNII-XII grossly intact ?Psych: Normal mood, normal affect ? ?Data Reviewed: ? ?Imaging: ?No dedicated chest imaging on file ? ?PFT: ?11/18/2009 ?Spirometry FVC 1.69 (62%) FEV1 1.35 (60%) ratio 80 ? ?02/05/2005 ?FVC 2.87 (89%) FEV1 2.32 (92%) ratio 81 TLC 91% DLCO 71% ? ?Labs: ?CBC ?   ?Component Value Date/Time  ? WBC 7.5 02/11/2021 1023  ? RBC 4.46 02/11/2021 1023  ? HGB 13.3 02/11/2021 1023  ? HCT 41.4 02/11/2021 1023  ? PLT 294 02/11/2021 1023  ? MCV 93 02/11/2021 1023  ? MCH 29.8 02/11/2021 1023  ? MCHC 32.1 02/11/2021 1023  ? RDW 12.1 02/11/2021 1023  ? ? ? ?   ?Assessment & Plan:  ? ?Discussion: ?51 year old female with never smoker with asthma, allergic rhinitis and HTN who presents as a new consult for asthma. Chest pain/pressure with negative cardiac work-up. Reduced exercise tolerance to one mild daily. Atypical symptoms for asthma however will trial addition of LAMA and evaluate with PFTs. Discussed clinical course and management of asthma including bronchodilator regimen and action plan for exacerbation. ? ?Mild persistent asthma ?ARRANGE for pulmonary function tests ?CONTINUE Breo 200-25 mcg ONE puff ONCE a day ?START Spiriva 2.5 mcg TWO puffs ONCE a day ?CXR today for baseline: CXR 09/23/21 - Normal. No infiltrate, effusion or edema ? ?Health Maintenance ?Immunization History  ?Administered Date(s)  Administered  ? Influenza Split 06/03/2011  ? Influenza Whole 04/27/2012  ? Influenza,inj,Quad PF,6+ Mos 07/22/2018  ? Influenza-Unspecified 03/05/2021  ? PFIZER(Purple Top)SARS-COV-2 Vaccination 08/06/2019, 08/28/2019, 05/11/2020, 11/26/2020, 04/30/2021  ? Tdap 05/21/2011, 08/14/2021  ? ?CT Lung Screen - never smoker. Not qualified ? ?Orders Placed This Encounter  ?Procedures  ? DG Chest 2 View  ?  Standing Status:   Future  ?  Number of Occurrences:   1  ?  Standing Expiration Date:   09/24/2022  ?  Order Specific Question:   Reason for Exam (SYMPTOM  OR DIAGNOSIS REQUIRED)  ?  Answer:   Chest pressure  ?  Order Specific Question:   Is patient pregnant?  ?  Answer:   No  ?  Order Specific Question:   Preferred  imaging location?  ?  Answer:   Internal  ? Pulmonary function test  ?  Standing Status:   Future  ?  Standing Expiration Date:   09/24/2022  ?  Order Specific Question:   Where should this test be performed?  ?  Answer:   [facility]  ? ?Meds ordered this encounter  ?Medications  ? Tiotropium Bromide Monohydrate (SPIRIVA RESPIMAT) 2.5 MCG/ACT AERS  ?  Sig: Inhale 2 puffs into the lungs daily.  ?  Dispense:  4 g  ?  Refill:  0  ?  Order Specific Question:   Lot Number?  ?  Answer:   [id number]  ?  Order Specific Question:   NDC  ?  Answer:   [id number] [[id number]]  ? ? ?No follow-ups on file. After PFTs ? ?I have spent a total time of 45-minutes on the day of the appointment reviewing prior documentation, coordinating care and discussing medical diagnosis and plan with the patient/family. Imaging, labs and tests included in this note have been reviewed and interpreted independently by me. ? ?Hasna Stefanik Mechele Collin, MD ?[facility] ?09/23/2021 10:32 AM  ?Office Number [phone] ? ? ?

## 2021-09-23 NOTE — Patient Instructions (Addendum)
Mild persistent asthma ?ARRANGE for pulmonary function tests ?CONTINUE Breo 200-25 mcg ONE puff ONCE a day ?START Spiriva 2.5 mcg TWO puffs ONCE a day. Samples provided x 1 month ? ?Follow-up with me after PFTs in April or May. OK to schedule separately ?

## 2021-09-29 ENCOUNTER — Encounter: Payer: Self-pay | Admitting: Pulmonary Disease

## 2021-10-15 ENCOUNTER — Encounter (HOSPITAL_BASED_OUTPATIENT_CLINIC_OR_DEPARTMENT_OTHER): Payer: Self-pay | Admitting: Cardiovascular Disease

## 2021-10-15 ENCOUNTER — Ambulatory Visit (HOSPITAL_BASED_OUTPATIENT_CLINIC_OR_DEPARTMENT_OTHER): Payer: BC Managed Care – PPO | Admitting: Cardiovascular Disease

## 2021-10-15 DIAGNOSIS — R0789 Other chest pain: Secondary | ICD-10-CM | POA: Diagnosis not present

## 2021-10-15 DIAGNOSIS — I10 Essential (primary) hypertension: Secondary | ICD-10-CM

## 2021-10-15 DIAGNOSIS — R55 Syncope and collapse: Secondary | ICD-10-CM | POA: Diagnosis not present

## 2021-10-15 DIAGNOSIS — R030 Elevated blood-pressure reading, without diagnosis of hypertension: Secondary | ICD-10-CM | POA: Diagnosis not present

## 2021-10-15 MED ORDER — AMLODIPINE BESYLATE 2.5 MG PO TABS: 2.5000 mg | ORAL_TABLET | Freq: Every day | ORAL | 3 refills | Status: DC

## 2021-10-15 NOTE — Assessment & Plan Note (Signed)
Blood pressure is well controlled on amlodipine.  She is exercising regularly and limiting her sodium.  She did wonder if she might be able to stop the amlodipine.  Based on the fact that her diastolic blood pressure is 80, I would not recommend it at this time. ?

## 2021-10-15 NOTE — Progress Notes (Signed)
? ? ?Cardiology Office Note ? ? ?Date:  10/15/2021  ? ?ID:  Lauren Carroll, DOB [birth year], MRN [id number] ? ?PCP:  Dorothyann Peng, MD  ?Cardiologist:   Chilton Si, MD  ? ?No chief complaint on file. ? ? ?  ?History of Present Illness: ?Lauren Carroll is a 52 y.o. female with asthma who is being seen today for the follow-up of syncope at the request of Dorothyann Peng, MD first seen 02/22. She was exercising by running inside her house.  She stopped running and felt some palpitations.  She stopped to rest and the next thing she noticed she woke up on the floor.  She got up and finished her run and felt fine.  She had no chest pain or pressure.  She has exercised since then and felt fine.  Her episode was thought to be due to volume depletion after exercising and intermittent fasting. She reported palpitations and wore a monitor that showed no arrhythmias.  ? ?Today, she complains of  chest discomfort that occurs at random times. It does not occur with exertion. She is still exercising by running and is intermittently fasting. Shortness of breath occurs when running and this is baseline. She denies recent episodes of syncope. She still thinks the episode of syncope was due to hormonal changes. She is staying hydrated and drinking up to 5 bottles of water. ?She rarely checks her blood pressure at home. It is stable at today's visit. ?BP Readings from Last 3 Encounters:  ?10/15/21 116/80  ?09/23/21 126/74  ?08/14/21 118/80  ?  ?She is very conscious about her salt intake because she is very sensitive to salt. She would like to stop her amlodipine.  ? ?She denies chest pain, shortness of breath, palpitations, lightheadedness, headaches, syncope, LE edema, orthopnea, PND.  ? ? ?Past Medical History:  ?Diagnosis Date  ? Allergic rhinitis   ? Asthma   ? Asthmatic bronchitis   ? Elevated blood pressure reading 09/01/2020  ? History of URI (upper respiratory infection)   ? Overactive bladder   ? Palpitations 09/01/2020   ? Syncope and collapse 09/01/2020  ? ? ?Past Surgical History:  ?Procedure Laterality Date  ? BREAST EXCISIONAL BIOPSY Left   ? benign  ? ? ? ?Current Outpatient Medications  ?Medication Sig Dispense Refill  ? albuterol (PROVENTIL) (2.5 MG/3ML) 0.083% nebulizer solution Take 3 mLs (2.5 mg total) by nebulization every 4 (four) hours as needed for wheezing or shortness of breath. 75 mL 11  ? albuterol (VENTOLIN HFA) 108 (90 Base) MCG/ACT inhaler INHALE 2 PUFFS BY MOUTH EVERY 6 HOURS AS NEEDED FOR WHEEZING FOR SHORTNESS OF BREATH 3 each 2  ? cetirizine (ZYRTEC) 10 MG tablet Take 10 mg by mouth daily.    ? famotidine (PEPCID) 20 MG tablet Take 1 tablet (20 mg total) by mouth 2 (two) times daily. 60 tablet 1  ? fluticasone furoate-vilanterol (BREO ELLIPTA) 200-25 MCG/INH AEPB Inhale 1 puff by mouth once daily 60 each 5  ? Lactobacillus (PROBIOTIC ACIDOPHILUS PO) Take 1 capsule by mouth daily.    ? Melatonin 5 MG CAPS Take 1 capsule by mouth.    ? meloxicam (MOBIC) 15 MG tablet Take 1 tablet (15 mg total) by mouth daily. Uses PRN 30 tablet 3  ? montelukast (SINGULAIR) 10 MG tablet Take 1 tablet (10 mg total) by mouth daily. 90 tablet 2  ? Multiple Vitamin (MULTIVITAMINS PO) Take 1 tablet by mouth once a day    ? UNABLE TO FIND  Med Name: black Joanna Puff takes 2 caps daily.    ? Vitamin D, Cholecalciferol, 50 MCG (2000 UT) CAPS Take by mouth daily.    ? amLODipine (NORVASC) 2.5 MG tablet Take 1 tablet (2.5 mg total) by mouth daily. 90 tablet 3  ? Tiotropium Bromide Monohydrate (SPIRIVA RESPIMAT) 2.5 MCG/ACT AERS Inhale 2 puffs into the lungs daily. (Patient not taking: Reported on 10/15/2021) 4 g 0  ? ?No current facility-administered medications for this visit.  ? ? ?Allergies:   Cephalosporins, Penicillins, and Sulfa drugs cross reactors  ? ? ?Social History:  The patient  reports that she has never smoked. She has never used smokeless tobacco. She reports that she does not drink alcohol and does not use drugs.  ? ?Family  History:  The patient's family history includes Coronary artery disease in her father; Diabetes in her father; Heart attack in her father; Hypertension in her brother, father, mother, and sister; Transient ischemic attack in her maternal grandmother.  ? ? ?ROS:  Please see the history of present illness.   Otherwise, review of systems are positive for chest discomfort.   All other systems are reviewed and negative.  ? ? ?PHYSICAL EXAM: ?VS:  BP 116/80 (BP Location: Left Arm, Patient Position: Sitting, Cuff Size: Normal)   Pulse 77   Ht 5\' 4"  (1.626 m)   Wt 187 lb 6.4 oz (85 kg)   BMI 32.17 kg/m?  , BMI Body mass index is 32.17 kg/m?. ?GENERAL:  Well appearing ?HEENT:  Pupils equal round and reactive, fundi not visualized, oral mucosa unremarkable ?NECK:  No jugular venous distention, waveform within normal limits, carotid upstroke brisk and symmetric, no bruits, no thyromegaly ?LYMPHATICS:  No cervical adenopathy ?LUNGS:  Clear to auscultation bilaterally ?HEART:  RRR.  PMI not displaced or sustained,S1 and S2 within normal limits, no S3, no S4, no clicks, no rubs, no murmurs ?ABD:  Flat, positive bowel sounds normal in frequency in pitch, no bruits, no rebound, no guarding, no midline pulsatile mass, no hepatomegaly, no splenomegaly ?EXT:  2 plus pulses throughout, no edema, no cyanosis no clubbing ?SKIN:  No rashes no nodules ?NEURO:  Cranial nerves II through XII grossly intact, motor grossly intact throughout ?PSYCH:  Cognitively intact, oriented to person place and time ? ? ?EKG:  EKG is ordered today. ?08/30/2020: sinus bradycardia.  Rate 57 bpm.  Cannot rule out LA enlargement. ?10/15/2021: Sinus rhythm rate-77 bpm ? ? ?Recent Labs: ?02/11/2021: Hemoglobin 13.3; Platelets 294 ?08/14/2021: ALT 22; BUN 10; Creatinine, Ser 0.80; Potassium 4.3; Sodium 139  ? ? ?Lipid Panel ?   ?Component Value Date/Time  ? CHOL 224 (H) 02/11/2021 1023  ? TRIG 52 02/11/2021 1023  ? HDL 94 02/11/2021 1023  ? CHOLHDL 2.4 02/11/2021  1023  ? LDLCALC 121 (H) 02/11/2021 1023  ? ?  ? ?Wt Readings from Last 3 Encounters:  ?10/15/21 187 lb 6.4 oz (85 kg)  ?09/23/21 190 lb 12.8 oz (86.5 kg)  ?08/14/21 183 lb 6.4 oz (83.2 kg)  ?  ? ? ?ASSESSMENT AND PLAN: ? ?Benign hypertension ?Blood pressure is well controlled on amlodipine.  She is exercising regularly and limiting her sodium.  She did wonder if she might be able to stop the amlodipine.  Based on the fact that her diastolic blood pressure is 80, I would not recommend it at this time. ? ?Chest pressure ?Ms. Greb has been experiencing episodes of chest tightness.  However her episodes tend to occur for 2 weeks at a  time and are not exertional.  She runs regularly and has no exertional symptoms.  I do not think her symptoms are related to ischemia.  She is going to keep working with her provider on her asthma.  She thinks that it may also be hormonally mediated.  We will get a coronary calcium score, which would also better help us to understand what to do about her lipids. ? ?Syncope and collapse ?No recurrent episodes.  Her first episode was thought to be due to intravascular volume depletion and intermittent fasting. ? ? ? ?Current medicines are reviewed at length with the patient today.  The patient does not have concerns regarding medicines. ? ?The following changes have been made:  no change  ? ?Labs/ tests ordered today include: calcium score test ? ?Orders Placed This Encounter  ?Procedures  ? CT CARDIAC SCORING (SELF PAY ONLY)  ? EKG 12-Lead  ? ?Disposition:   FU with Santonio Speakman C. Duke Salviaandolph, MD, [facility] or PharmD as needed. ? ? ?I,Zite Okoli,acting as a Neurosurgeonscribe for [employer] [place], MD.,have documented all relevant documentation on the behalf of Chilton Siiffany Doddridge, MD,as directed by  Chilton Siiffany [place], MD while in the presence of Chilton Siiffany Sheridan, MD.  ? ?I, Shaunak Kreis C. Duke Salviaandolph, MD have reviewed all documentation for this visit.  The documentation of the exam, diagnosis, procedures, and orders on  10/15/2021 are all accurate and complete. ? ? ? ?Signed, ?Dorsel Flinn C. Duke Salviaandolph, MD, [facility]  ?10/15/2021 5:24 PM    ?[facility] ?

## 2021-10-15 NOTE — Patient Instructions (Addendum)
Medication Instructions:  °Your physician recommends that you continue on your current medications as directed. Please refer to the Current Medication list given to you today.  ° °Labwork: °NONE ° °Testing/Procedures: °CALCIUM SCORE - THIS WILL COST YOU $99 OUT OF POCKET  ° °Follow-Up: °AS NEEDED   °  ° °

## 2021-10-15 NOTE — Assessment & Plan Note (Signed)
Lauren Carroll has been experiencing episodes of chest tightness.  However her episodes tend to occur for 2 weeks at a time and are not exertional.  She runs regularly and has no exertional symptoms.  I do not think her symptoms are related to ischemia.  She is going to keep working with her provider on her asthma.  She thinks that it may also be hormonally mediated.  We will get a coronary calcium score, which would also better help [place] to understand what to do about her lipids. ?

## 2021-10-15 NOTE — Assessment & Plan Note (Signed)
No recurrent episodes.  Her first episode was thought to be due to intravascular volume depletion and intermittent fasting. ?

## 2021-10-22 ENCOUNTER — Telehealth (HOSPITAL_BASED_OUTPATIENT_CLINIC_OR_DEPARTMENT_OTHER): Payer: Self-pay | Admitting: Cardiovascular Disease

## 2021-10-22 NOTE — Telephone Encounter (Signed)
Left message regarding the Friday 11/28/21 1:15 pm Calcium Scoring appointment----arrival time is 1:00pm for check in--[address], [address]---will mail information to patient ?

## 2021-11-11 ENCOUNTER — Encounter: Payer: Self-pay | Admitting: Pulmonary Disease

## 2021-11-11 ENCOUNTER — Ambulatory Visit (INDEPENDENT_AMBULATORY_CARE_PROVIDER_SITE_OTHER): Payer: BC Managed Care – PPO | Admitting: Pulmonary Disease

## 2021-11-11 ENCOUNTER — Ambulatory Visit: Payer: BC Managed Care – PPO | Admitting: Pulmonary Disease

## 2021-11-11 VITALS — BP 130/92 | HR 63 | Ht 64.0 in | Wt 186.0 lb

## 2021-11-11 DIAGNOSIS — J4530 Mild persistent asthma, uncomplicated: Secondary | ICD-10-CM

## 2021-11-11 DIAGNOSIS — R0602 Shortness of breath: Secondary | ICD-10-CM | POA: Diagnosis not present

## 2021-11-11 LAB — PULMONARY FUNCTION TEST
DL/VA % pred: 119 %
DL/VA: 5.15 ml/min/mmHg/L
DLCO cor % pred: 81 %
DLCO cor: 16.93 ml/min/mmHg
DLCO unc % pred: 81 %
DLCO unc: 16.93 ml/min/mmHg
FEF 25-75 Post: 3.1 L/sec
FEF 25-75 Pre: 2.7 L/sec
FEF2575-%Change-Post: 14 %
FEF2575-%Pred-Post: 130 %
FEF2575-%Pred-Pre: 113 %
FEV1-%Change-Post: 1 %
FEV1-%Pred-Post: 77 %
FEV1-%Pred-Pre: 76 %
FEV1-Post: 1.76 L
FEV1-Pre: 1.74 L
FEV1FVC-%Change-Post: 2 %
FEV1FVC-%Pred-Pre: 110 %
FEV6-%Change-Post: -1 %
FEV6-%Pred-Post: 69 %
FEV6-%Pred-Pre: 70 %
FEV6-Post: 1.91 L
FEV6-Pre: 1.94 L
FEV6FVC-%Change-Post: 0 %
FEV6FVC-%Pred-Post: 102 %
FEV6FVC-%Pred-Pre: 102 %
FVC-%Change-Post: -1 %
FVC-%Pred-Post: 67 %
FVC-%Pred-Pre: 68 %
FVC-Post: 1.92 L
FVC-Pre: 1.95 L
Post FEV1/FVC ratio: 92 %
Post FEV6/FVC ratio: 100 %
Pre FEV1/FVC ratio: 89 %
Pre FEV6/FVC Ratio: 100 %
RV % pred: 76 %
RV: 1.4 L
TLC % pred: 67 %
TLC: 3.43 L

## 2021-11-11 NOTE — Progress Notes (Signed)
? ? ?Subjective:  ? ?PATIENT ID: Particia Carroll GENDER: female DOB: [birth year], MRN: [id number] ? ? ?HPI ? ?Chief Complaint  ?Patient presents with  ? Follow-up  ?  PFT results ?idn't start spiriva wanted to see if breo was working also read not to take both together  ? ? ?Reason for Visit: Follow-up ? ?Ms. Lauren Carroll is a 51 year old female with never smoker with asthma, allergic rhinitis and HTN who presents as a new consult for asthma. ? ?Synopsis ?Previously seen by Dr. Delford Field in 2014. She was previously on Flovent and Advair. Currently on Breo for the last year. She reports she is having frequent chest cold and chest pressure that has worsened in the last few months. It seems be cyclic occurring associated with her prior menses since her menopause started last year. She has had cardiac evaluation which was negative. She previously a runner 1-5 miles daily but has reduced to a mile daily due to palpitations. Work-up with cardiac monitor was negative.  She does report reflux symptoms. ? ?11/11/21 ?Since our last visit, Spiriva was added to her Breo regimen. However has not taken her Spiriva. Continues to have chest tightness/pressure that seem to cycle with her prior menses schedule. Has been taking pepcid with some improvement. ? ?Social History: ?Never smoker ? ?Past Medical History:  ?Diagnosis Date  ? Allergic rhinitis   ? Asthma   ? Asthmatic bronchitis   ? Elevated blood pressure reading 09/01/2020  ? History of URI (upper respiratory infection)   ? Overactive bladder   ? Palpitations 09/01/2020  ? Syncope and collapse 09/01/2020  ?  ? ?Family History  ?Problem Relation Age of Onset  ? Hypertension Mother   ? Diabetes Father   ? Hypertension Father   ? Coronary artery disease Father   ? Heart attack Father   ? Hypertension Sister   ? Hypertension Brother   ? Transient ischemic attack Maternal Grandmother   ?  ? ?Social History  ? ?Occupational History  ? Occupation: Equities trader  ?  Employer: [employer]  ?Tobacco Use  ? Smoking status: Never  ? Smokeless tobacco: Never  ?Vaping Use  ? Vaping Use: Never used  ?Substance and Sexual Activity  ? Alcohol use: No  ? Drug use: No  ? Sexual activity: Not on file  ? ? ?Allergies  ?Allergen Reactions  ? Cephalosporins   ? Penicillins   ? Sulfa Drugs Cross Reactors   ?  itching  ?  ? ?Outpatient Medications Prior to Visit  ?Medication Sig Dispense Refill  ? albuterol (PROVENTIL) (2.5 MG/3ML) 0.083% nebulizer solution Take 3 mLs (2.5 mg total) by nebulization every 4 (four) hours as needed for wheezing or shortness of breath. 75 mL 11  ? albuterol (VENTOLIN HFA) 108 (90 Base) MCG/ACT inhaler INHALE 2 PUFFS BY MOUTH EVERY 6 HOURS AS NEEDED FOR WHEEZING FOR SHORTNESS OF BREATH 3 each 2  ? amLODipine (NORVASC) 2.5 MG tablet Take 1 tablet (2.5 mg total) by mouth daily. 90 tablet 3  ? cetirizine (ZYRTEC) 10 MG tablet Take 10 mg by mouth daily.    ? famotidine (PEPCID) 20 MG tablet Take 1 tablet (20 mg total) by mouth 2 (two) times daily. 60 tablet 1  ? fluticasone furoate-vilanterol (BREO ELLIPTA) 200-25 MCG/INH AEPB Inhale 1 puff by mouth once daily 60 each 5  ? Lactobacillus (PROBIOTIC ACIDOPHILUS PO) Take 1 capsule by mouth daily.    ? meloxicam (MOBIC) 15 MG  tablet Take 1 tablet (15 mg total) by mouth daily. Uses PRN 30 tablet 3  ? montelukast (SINGULAIR) 10 MG tablet Take 1 tablet (10 mg total) by mouth daily. 90 tablet 2  ? Multiple Vitamin (MULTIVITAMINS PO) Take 1 tablet by mouth once a day    ? UNABLE TO FIND Med Name: black Joanna Puff takes 2 caps daily.    ? Vitamin D, Cholecalciferol, 50 MCG (2000 UT) CAPS Take by mouth daily.    ? Melatonin 5 MG CAPS Take 1 capsule by mouth. (Patient not taking: Reported on 11/11/2021)    ? Tiotropium Bromide Monohydrate (SPIRIVA RESPIMAT) 2.5 MCG/ACT AERS Inhale 2 puffs into the lungs daily. (Patient not taking: Reported on 10/15/2021) 4 g 0  ? ?No facility-administered medications prior to visit.  ? ? ?Review of Systems   ?Constitutional:  Negative for chills, diaphoresis, fever, malaise/fatigue and weight loss.  ?HENT:  Negative for congestion.   ?Respiratory:  Negative for cough, hemoptysis, sputum production, shortness of breath and wheezing.   ?Cardiovascular:  Negative for chest pain (chest pressure), palpitations and leg swelling.  ?Gastrointestinal:  Positive for heartburn.  ? ? ?Objective:  ? ?Vitals:  ? 11/11/21 1057  ?BP: (!) 130/92  ?Pulse: 63  ?SpO2: 99%  ?Weight: 186 lb (84.4 kg)  ?Height: 5\' 4"  (1.626 m)  ? ?SpO2: 99 % ?O2 Device: None (Room air) ? ?Physical Exam: ?General: Well-appearing, no acute distress ?HENT: [place], AT ?Eyes: EOMI, no scleral icterus ?Respiratory: Clear to auscultation bilaterally.  No crackles, wheezing or rales ?Cardiovascular: RRR, -M/R/G, no JVD ?Extremities:-Edema,-tenderness ?Neuro: AAO x4, CNII-XII grossly intact ?Psych: Normal mood, normal affect ? ?Data Reviewed: ? ?Imaging: ?CXR 09/23/21 - No active issues ? ?PFT: ?11/11/2021 ?FVC 1.92 (67%) FEV1 1.76 (77%) Ratio 89  TLC 67% DLCO 81% ?Interpretation: Mild/minmal restrictive lung defect present. No obstruction while using Breo ? ?11/18/2009 ?Spirometry FVC 1.69 (62%) FEV1 1.35 (60%) ratio 80 ? ?02/05/2005 ?FVC 2.87 (89%) FEV1 2.32 (92%) ratio 81 TLC 91% DLCO 71% ? ?Labs: ?CBC ?   ?Component Value Date/Time  ? WBC 7.5 02/11/2021 1023  ? RBC 4.46 02/11/2021 1023  ? HGB 13.3 02/11/2021 1023  ? HCT 41.4 02/11/2021 1023  ? PLT 294 02/11/2021 1023  ? MCV 93 02/11/2021 1023  ? MCH 29.8 02/11/2021 1023  ? MCHC 32.1 02/11/2021 1023  ? RDW 12.1 02/11/2021 1023  ? ? ? ?   ?Assessment & Plan:  ? ?Discussion: ?51 year old female never smoker with asthma, allergic rhinitis and HTN who presents for follow-up. Prior cardiac work-up for chest pain/pressure negative. Symptoms have resulted in reduced exercise tolerance. PFTs reviewed today with no obstructive defect. Mild/Minimal restrictive defect with normal DLCO likely related to chest wall compliance or effort.  No perceived improvement on ICS/LABA. Her shortness of breath is unlikely related to a pulmonary process. ? ?Atypical shortness of breath ?CONTINUE Breo 200-25 mcg ONE puff ONCE a day AS NEEDED. Ok to stop if no change in symptoms ? ?Consider CT in the future if dyspnea persistent though this may be low-yield ? ?Health Maintenance ?Immunization History  ?Administered Date(s) Administered  ? Influenza Split 06/03/2011  ? Influenza Whole 04/27/2012  ? Influenza,inj,Quad PF,6+ Mos 07/22/2018  ? Influenza-Unspecified 03/05/2021  ? PFIZER(Purple Top)SARS-COV-2 Vaccination 08/06/2019, 08/28/2019, 05/11/2020, 11/26/2020, 04/30/2021  ? Tdap 05/21/2011, 08/14/2021  ? ?CT Lung Screen - never smoker. Not qualified ? ?No orders of the defined types were placed in this encounter. ? ?No orders of the defined types were  placed in this encounter. ? ? ?Return if symptoms worsen or fail to improve.  ? ?I have spent a total time of 30-minutes on the day of the appointment including chart review, data review, collecting history, coordinating care and discussing medical diagnosis and plan with the patient/family. Past medical history, allergies, medications were reviewed. Pertinent imaging, labs and tests included in this note have been reviewed and interpreted independently by me. ? ?Keiton Cosma Mechele CollinJane Bindi Klomp, MD ?[facility] ?11/11/2021 11:01 AM  ?Office Number [phone] ? ? ?

## 2021-11-11 NOTE — Progress Notes (Signed)
PFT done today. 

## 2021-11-11 NOTE — Patient Instructions (Addendum)
Atypical shortness of breath ?CONTINUE Breo 200-25 mcg ONE puff ONCE a day AS NEEDED. Ok to stop if no change in symptoms ?Please call our office if your symptoms are persistent in the future. We could consider CT chest if needed ? ?Follow-up as needed ?

## 2021-11-12 ENCOUNTER — Encounter: Payer: Self-pay | Admitting: Pulmonary Disease

## 2021-11-28 ENCOUNTER — Other Ambulatory Visit: Payer: BC Managed Care – PPO

## 2021-12-02 ENCOUNTER — Ambulatory Visit
Admission: RE | Admit: 2021-12-02 | Discharge: 2021-12-02 | Disposition: A | Discharge status: Home / Self Care | Payer: Self-pay | Source: Ambulatory Visit | Attending: Cardiovascular Disease | Admitting: Cardiovascular Disease

## 2021-12-02 DIAGNOSIS — I10 Essential (primary) hypertension: Secondary | ICD-10-CM

## 2022-01-01 ENCOUNTER — Other Ambulatory Visit: Payer: Self-pay | Admitting: Obstetrics and Gynecology

## 2022-01-01 DIAGNOSIS — Z1231 Encounter for screening mammogram for malignant neoplasm of breast: Secondary | ICD-10-CM

## 2022-01-06 ENCOUNTER — Ambulatory Visit
Admission: RE | Admit: 2022-01-06 | Discharge: 2022-01-06 | Disposition: A | Discharge status: Home / Self Care | Payer: BC Managed Care – PPO | Source: Ambulatory Visit | Attending: Obstetrics and Gynecology | Admitting: Obstetrics and Gynecology

## 2022-01-06 DIAGNOSIS — Z1231 Encounter for screening mammogram for malignant neoplasm of breast: Secondary | ICD-10-CM

## 2022-01-08 ENCOUNTER — Other Ambulatory Visit: Payer: Self-pay

## 2022-01-08 ENCOUNTER — Other Ambulatory Visit: Payer: Self-pay | Admitting: Internal Medicine

## 2022-01-08 MED ORDER — ALBUTEROL SULFATE HFA 108 (90 BASE) MCG/ACT IN AERS: INHALATION_SPRAY | RESPIRATORY_TRACT | 1 refills | Status: DC

## 2022-01-27 ENCOUNTER — Other Ambulatory Visit: Payer: Self-pay | Admitting: Internal Medicine

## 2022-02-01 ENCOUNTER — Other Ambulatory Visit: Payer: Self-pay | Admitting: Internal Medicine

## 2022-02-19 ENCOUNTER — Encounter: Payer: BC Managed Care – PPO | Admitting: Internal Medicine

## 2022-02-23 ENCOUNTER — Ambulatory Visit (INDEPENDENT_AMBULATORY_CARE_PROVIDER_SITE_OTHER): Payer: BC Managed Care – PPO | Admitting: Internal Medicine

## 2022-02-23 ENCOUNTER — Encounter: Payer: Self-pay | Admitting: Internal Medicine

## 2022-02-23 VITALS — BP 120/78 | HR 63 | Temp 97.7°F | Ht 64.0 in | Wt 187.4 lb

## 2022-02-23 DIAGNOSIS — Z79899 Other long term (current) drug therapy: Secondary | ICD-10-CM

## 2022-02-23 DIAGNOSIS — I10 Essential (primary) hypertension: Secondary | ICD-10-CM

## 2022-02-23 DIAGNOSIS — Z23 Encounter for immunization: Secondary | ICD-10-CM | POA: Diagnosis not present

## 2022-02-23 DIAGNOSIS — Z0000 Encounter for general adult medical examination without abnormal findings: Secondary | ICD-10-CM | POA: Diagnosis not present

## 2022-02-23 DIAGNOSIS — E559 Vitamin D deficiency, unspecified: Secondary | ICD-10-CM | POA: Diagnosis not present

## 2022-02-23 LAB — POCT URINALYSIS DIPSTICK
Bilirubin, UA: NEGATIVE
Blood, UA: NEGATIVE
Glucose, UA: NEGATIVE
Ketones, UA: NEGATIVE
Leukocytes, UA: NEGATIVE
Nitrite, UA: NEGATIVE
Protein, UA: NEGATIVE
Spec Grav, UA: >=1.03 — AB (ref 1.010–1.025)
Urobilinogen, UA: 0.2 E.U./dL
pH, UA: 6 (ref 5.0–8.0)

## 2022-02-23 MED ORDER — BREO ELLIPTA 200-25 MCG/ACT IN AEPB
INHALATION_SPRAY | RESPIRATORY_TRACT | 5 refills | Status: DC
Start: 2022-02-23 — End: 2022-08-25

## 2022-02-23 MED ORDER — MONTELUKAST SODIUM 10 MG PO TABS: 10.0000 mg | ORAL_TABLET | Freq: Every day | ORAL | 2 refills | Status: DC

## 2022-02-23 MED ORDER — ALBUTEROL SULFATE HFA 108 (90 BASE) MCG/ACT IN AERS
INHALATION_SPRAY | RESPIRATORY_TRACT | 2 refills | Status: DC
Start: 2022-02-23 — End: 2023-03-04

## 2022-02-23 NOTE — Progress Notes (Unsigned)
Barnet Glasgow Martin,acting as a Education administrator for Maximino Greenland, MD.,have documented all relevant documentation on the behalf of Maximino Greenland, MD,as directed by  Maximino Greenland, MD while in the presence of Maximino Greenland, MD.   Subjective:     Patient ID: Lauren Carroll , female    DOB: 1970/12/01 , 51 y.o.   MRN: [id number]   Chief Complaint  Patient presents with   Annual Exam    HPI  Patient presents today for HM. She is followed by Dr. Garwin Brothers for her GYN exams. Patient reports compliance with medications. She denies having any headaches, chest pain and shortness of breath. Patient has no other issues today.   Patient also followed by Cardiology, last EKG was performed 10/16/2021.  BP Readings from Last 3 Encounters: 02/23/22 : 120/78 11/11/21 : (!) 130/92 10/15/21 : 116/80    Hypertension This is a chronic problem. The current episode started more than 1 year ago. The problem is unchanged. The problem is controlled. Past treatments include calcium channel blockers. The current treatment provides moderate improvement. There are no compliance problems.      Past Medical History:  Diagnosis Date   Allergic rhinitis    Asthma    Asthmatic bronchitis    Elevated blood pressure reading 09/01/2020   History of URI (upper respiratory infection)    Overactive bladder    Palpitations 09/01/2020   Syncope and collapse 09/01/2020     Family History  Problem Relation Age of Onset   Hypertension Mother    Diabetes Father    Hypertension Father    Coronary artery disease Father    Heart attack Father    Hypertension Sister    Hypertension Brother    Transient ischemic attack Maternal Grandmother      Current Outpatient Medications:    albuterol (PROVENTIL) (2.5 MG/3ML) 0.083% nebulizer solution, Take 3 mLs (2.5 mg total) by nebulization every 4 (four) hours as needed for wheezing or shortness of breath., Disp: 75 mL, Rfl: 11   amLODipine (NORVASC) 2.5 MG tablet, Take 1 tablet  (2.5 mg total) by mouth daily., Disp: 90 tablet, Rfl: 3   cetirizine (ZYRTEC) 10 MG tablet, Take 10 mg by mouth daily., Disp: , Rfl:    Lactobacillus (PROBIOTIC ACIDOPHILUS PO), Take 1 capsule by mouth daily., Disp: , Rfl:    Multiple Vitamin (MULTIVITAMINS PO), Take 1 tablet by mouth once a day, Disp: , Rfl:    Vitamin D, Cholecalciferol, 50 MCG (2000 UT) CAPS, Take by mouth daily., Disp: , Rfl:    albuterol (VENTOLIN HFA) 108 (90 Base) MCG/ACT inhaler, INHALE 2 PUFFS BY MOUTH EVERY 6 HOURS AS NEEDED FOR WHEEZING OR  SHORTNESS  OF  BREATH, Disp: 54 g, Rfl: 2   BREO ELLIPTA 200-25 MCG/ACT AEPB, INHALE 1 PUFF ONCE DAILY, Disp: 60 each, Rfl: 5   Melatonin 5 MG CAPS, Take 1 capsule by mouth. (Patient not taking: Reported on 11/11/2021), Disp: , Rfl:    meloxicam (MOBIC) 15 MG tablet, Take 1 tablet (15 mg total) by mouth daily. Uses PRN (Patient not taking: Reported on 02/23/2022), Disp: 30 tablet, Rfl: 3   montelukast (SINGULAIR) 10 MG tablet, Take 1 tablet (10 mg total) by mouth daily., Disp: 90 tablet, Rfl: 2   UNABLE TO FIND, Med Name: black Darcella Gasman takes 2 caps daily., Disp: , Rfl:    Allergies  Allergen Reactions   Cephalosporins    Penicillins    Sulfa Drugs Cross Reactors  itching      The patient states she uses none for birth control. Last LMP was Patient's last menstrual period was 12/24/2021 (approximate).. Negative for Dysmenorrhea. Negative for: breast discharge, breast lump(s), breast pain and breast self exam. Associated symptoms include abnormal vaginal bleeding. Pertinent negatives include abnormal bleeding (hematology), anxiety, decreased libido, depression, difficulty falling sleep, dyspareunia, history of infertility, nocturia, sexual dysfunction, sleep disturbances, urinary incontinence, urinary urgency, vaginal discharge and vaginal itching. Diet regular.The patient states her exercise level is  moderate.  . The patient's tobacco use is:  Social History   Tobacco Use   Smoking Status Never  Smokeless Tobacco Never  . She has been exposed to passive smoke. The patient's alcohol use is:  Social History   Substance and Sexual Activity  Alcohol Use No    Review of Systems  Constitutional: Negative.   HENT: Negative.    Eyes: Negative.   Respiratory: Negative.    Cardiovascular: Negative.   Gastrointestinal: Negative.   Endocrine: Negative.   Genitourinary: Negative.   Musculoskeletal: Negative.   Skin: Negative.   Allergic/Immunologic: Negative.   Neurological: Negative.   Hematological: Negative.   Psychiatric/Behavioral: Negative.       Today's Vitals   02/23/22 0902  BP: 120/78  Pulse: 63  Temp: 97.7 F (36.5 C)  TempSrc: Oral  Weight: 187 lb 6.4 oz (85 kg)   Body mass index is 32.17 kg/m.  Wt Readings from Last 3 Encounters:  02/23/22 187 lb 6.4 oz (85 kg)  11/11/21 186 lb (84.4 kg)  10/15/21 187 lb 6.4 oz (85 kg)     Objective:  Physical Exam Vitals and nursing note reviewed.  Constitutional:      Appearance: Normal appearance.  HENT:     Head: Normocephalic and atraumatic.     Right Ear: Tympanic membrane, ear canal and external ear normal.     Left Ear: Tympanic membrane, ear canal and external ear normal.     Nose: Nose normal.     Mouth/Throat:     Mouth: Mucous membranes are moist.     Pharynx: Oropharynx is clear.  Eyes:     Extraocular Movements: Extraocular movements intact.     Conjunctiva/sclera: Conjunctivae normal.     Pupils: Pupils are equal, round, and reactive to light.  Cardiovascular:     Rate and Rhythm: Normal rate and regular rhythm.     Pulses: Normal pulses.     Heart sounds: Normal heart sounds.  Pulmonary:     Effort: Pulmonary effort is normal.     Breath sounds: Normal breath sounds.  Chest:  Breasts:    Tanner Score is 5.     Comments: Not examined She kept bra on for exam Abdominal:     General: Bowel sounds are normal.     Palpations: Abdomen is soft.  Genitourinary:     Comments: deferred Musculoskeletal:        General: Normal range of motion.     Cervical back: Normal range of motion and neck supple.  Skin:    General: Skin is warm and dry.  Neurological:     General: No focal deficit present.     Mental Status: She is alert and oriented to person, place, and time.  Psychiatric:        Mood and Affect: Mood normal.        Behavior: Behavior normal.      Assessment And Plan:     1. Annual physical exam Comments: A full  exam was performed. Importance of monthly self breast exams was discussed with the patient. PATIENT IS ADVISED TO GET 30-45 MINUTES REGULAR EXERCISE NO LESS THAN FOUR TO FIVE DAYS PER WEEK - BOTH WEIGHTBEARING EXERCISES AND AEROBIC ARE RECOMMENDED.  PATIENT IS ADVISED TO FOLLOW A HEALTHY DIET WITH AT LEAST SIX FRUITS/VEGGIES PER DAY, DECREASE INTAKE OF RED MEAT, AND TO INCREASE FISH INTAKE TO TWO DAYS PER WEEK.  MEATS/FISH SHOULD NOT BE FRIED, BAKED OR BROILED IS PREFERABLE.  IT IS ALSO IMPORTANT TO CUT BACK ON YOUR SUGAR INTAKE. PLEASE AVOID ANYTHING WITH ADDED SUGAR, CORN SYRUP OR OTHER SWEETENERS. IF YOU MUST USE A SWEETENER, YOU CAN TRY STEVIA. IT IS ALSO IMPORTANT TO AVOID ARTIFICIALLY SWEETENERS AND DIET BEVERAGES. LASTLY, I SUGGEST WEARING SPF 50 SUNSCREEN ON EXPOSED PARTS AND ESPECIALLY WHEN IN THE DIRECT SUNLIGHT FOR AN EXTENDED PERIOD OF TIME.  PLEASE AVOID FAST FOOD RESTAURANTS AND INCREASE YOUR WATER INTAKE. - CBC no Diff - CMP14+EGFR - Hemoglobin A1c - Lipid panel  2. Essential hypertension, benign Comments: Chronic, well controlled. She will c/w amlodipine 2.58m daily.  She will f/u in six months. Encouraged to follow low sodium diet.  - Microalbumin / Creatinine Urine Ratio - POCT Urinalysis Dipstick (81002)  3. Vitamin D deficiency Comments: I will check vitamin D level and supplement as needed.  - Vitamin D (25 hydroxy)  4. Immunization due - Zoster Recombinant (Shingrix )  5. Drug therapy - Vitamin B12  Patient  was given opportunity to ask questions. Patient verbalized understanding of the plan and was able to repeat key elements of the plan. All questions were answered to their satisfaction.   I, RMaximino Greenland MD, have reviewed all documentation for this visit. The documentation on 02/23/22 for the exam, diagnosis, procedures, and orders are all accurate and complete.   THE PATIENT IS ENCOURAGED TO PRACTICE SOCIAL DISTANCING DUE TO THE COVID-19 PANDEMIC.

## 2022-02-23 NOTE — Patient Instructions (Signed)

## 2022-02-24 ENCOUNTER — Encounter: Payer: Self-pay | Admitting: Internal Medicine

## 2022-02-24 LAB — CMP14+EGFR
ALT: 24 IU/L (ref 0–32)
AST: 24 IU/L (ref 0–40)
Albumin/Globulin Ratio: 1.5 (ref 1.2–2.2)
Albumin: 4.2 g/dL (ref 3.8–4.9)
Alkaline Phosphatase: 86 IU/L (ref 44–121)
BUN/Creatinine Ratio: 17 (ref 9–23)
BUN: 15 mg/dL (ref 6–24)
Bilirubin Total: 0.8 mg/dL (ref 0.0–1.2)
CO2: 25 mmol/L (ref 20–29)
Calcium: 9.2 mg/dL (ref 8.7–10.2)
Chloride: 101 mmol/L (ref 96–106)
Creatinine, Ser: 0.87 mg/dL (ref 0.57–1.00)
Globulin, Total: 2.8 g/dL (ref 1.5–4.5)
Glucose: 86 mg/dL (ref 70–99)
Potassium: 4.3 mmol/L (ref 3.5–5.2)
Sodium: 137 mmol/L (ref 134–144)
Total Protein: 7 g/dL (ref 6.0–8.5)
eGFR: 80 mL/min/{1.73_m2} (ref >59)

## 2022-02-24 LAB — CBC
Hematocrit: 40.5 % (ref 34.0–46.6)
Hemoglobin: 13.1 g/dL (ref 11.1–15.9)
MCH: 30.3 pg (ref 26.6–33.0)
MCHC: 32.3 g/dL (ref 31.5–35.7)
MCV: 94 fL (ref 79–97)
Platelets: 290 10*3/uL (ref 150–450)
RBC: 4.33 x10E6/uL (ref 3.77–5.28)
RDW: 11.8 % (ref 11.7–15.4)
WBC: 7.8 10*3/uL (ref 3.4–10.8)

## 2022-02-24 LAB — MICROALBUMIN / CREATININE URINE RATIO
Creatinine, Urine: 167.9 mg/dL
Microalb/Creat Ratio: 4 mg/g creat (ref 0–29)
Microalbumin, Urine: 7.5 ug/mL

## 2022-02-24 LAB — HEMOGLOBIN A1C
Est. average glucose Bld gHb Est-mCnc: 120 mg/dL
Hgb A1c MFr Bld: 5.8 % — ABNORMAL HIGH (ref 4.8–5.6)

## 2022-02-24 LAB — VITAMIN B12: Vitamin B-12: 980 pg/mL (ref 232–1245)

## 2022-02-24 LAB — LIPID PANEL
Chol/HDL Ratio: 2.5 ratio (ref 0.0–4.4)
Cholesterol, Total: 205 mg/dL — ABNORMAL HIGH (ref 100–199)
HDL: 81 mg/dL (ref >39)
LDL Chol Calc (NIH): 113 mg/dL — ABNORMAL HIGH (ref 0–99)
Triglycerides: 63 mg/dL (ref 0–149)
VLDL Cholesterol Cal: 11 mg/dL (ref 5–40)

## 2022-02-24 LAB — VITAMIN D 25 HYDROXY (VIT D DEFICIENCY, FRACTURES): Vit D, 25-Hydroxy: 43.7 ng/mL (ref 30.0–100.0)

## 2022-05-26 ENCOUNTER — Ambulatory Visit: Payer: BC Managed Care – PPO

## 2022-06-16 ENCOUNTER — Ambulatory Visit: Payer: BC Managed Care – PPO

## 2022-06-23 ENCOUNTER — Ambulatory Visit: Payer: BC Managed Care – PPO

## 2022-06-30 ENCOUNTER — Ambulatory Visit: Payer: BC Managed Care – PPO

## 2022-07-14 ENCOUNTER — Ambulatory Visit (INDEPENDENT_AMBULATORY_CARE_PROVIDER_SITE_OTHER): Payer: BC Managed Care – PPO

## 2022-07-14 VITALS — BP 126/70 | HR 66 | Temp 97.9°F

## 2022-07-14 DIAGNOSIS — Z23 Encounter for immunization: Secondary | ICD-10-CM | POA: Diagnosis not present

## 2022-07-14 NOTE — Progress Notes (Signed)
Patient presents today for 2nd shingles vaccine.  ?

## 2022-08-25 ENCOUNTER — Other Ambulatory Visit: Payer: Self-pay | Admitting: Internal Medicine

## 2022-08-26 ENCOUNTER — Encounter: Payer: Self-pay | Admitting: Internal Medicine

## 2022-08-26 ENCOUNTER — Ambulatory Visit: Payer: BC Managed Care – PPO | Admitting: Internal Medicine

## 2022-08-26 VITALS — BP 126/82 | HR 86 | Temp 97.7°F | Ht 64.0 in | Wt 196.6 lb

## 2022-08-26 DIAGNOSIS — J4520 Mild intermittent asthma, uncomplicated: Secondary | ICD-10-CM | POA: Diagnosis not present

## 2022-08-26 DIAGNOSIS — Z6833 Body mass index (BMI) 33.0-33.9, adult: Secondary | ICD-10-CM

## 2022-08-26 DIAGNOSIS — R7309 Other abnormal glucose: Secondary | ICD-10-CM

## 2022-08-26 DIAGNOSIS — E6609 Other obesity due to excess calories: Secondary | ICD-10-CM

## 2022-08-26 DIAGNOSIS — I10 Essential (primary) hypertension: Secondary | ICD-10-CM

## 2022-08-26 MED ORDER — MONTELUKAST SODIUM 10 MG PO TABS: 10.0000 mg | ORAL_TABLET | Freq: Every day | ORAL | 2 refills | Status: DC

## 2022-08-26 MED ORDER — BREO ELLIPTA 200-25 MCG/ACT IN AEPB: INHALATION_SPRAY | RESPIRATORY_TRACT | 5 refills | Status: DC

## 2022-08-26 MED ORDER — ALBUTEROL SULFATE (2.5 MG/3ML) 0.083% IN NEBU: 2.5000 mg | INHALATION_SOLUTION | RESPIRATORY_TRACT | 11 refills | Status: AC | PRN

## 2022-08-26 NOTE — Progress Notes (Signed)
I,Victoria T Hamilton,acting as a scribe for Maximino Greenland, MD.,have documented all relevant documentation on the behalf of Maximino Greenland, MD,as directed by  Maximino Greenland, MD while in the presence of Maximino Greenland, MD.    Subjective:     Patient ID: Lauren Carroll , female    DOB: 1970/12/08 , 52 y.o.   MRN: [id number]   Chief Complaint  Patient presents with   Hypertension    HPI  Patient presents today for HTN f/u. She reports compliance with meds. She denies headaches, chest pain and shortness of breath.   Patient reports having PAP in 2023. Letter sent to Dr Garwin Brothers for result.   Hypertension This is a chronic problem. The current episode started more than 1 year ago. The problem is unchanged. The problem is controlled. Past treatments include calcium channel blockers. The current treatment provides moderate improvement. There are no compliance problems.  There is no history of kidney disease, left ventricular hypertrophy or retinopathy.     Past Medical History:  Diagnosis Date   Allergic rhinitis    Asthma    Asthmatic bronchitis    Elevated blood pressure reading 09/01/2020   History of URI (upper respiratory infection)    Overactive bladder    Palpitations 09/01/2020   Syncope and collapse 09/01/2020     Family History  Problem Relation Age of Onset   Hypertension Mother    Diabetes Father    Hypertension Father    Coronary artery disease Father    Heart attack Father    Hypertension Sister    Hypertension Brother    Transient ischemic attack Maternal Grandmother      Current Outpatient Medications:    albuterol (VENTOLIN HFA) 108 (90 Base) MCG/ACT inhaler, INHALE 2 PUFFS BY MOUTH EVERY 6 HOURS AS NEEDED FOR WHEEZING OR  SHORTNESS  OF  BREATH, Disp: 54 g, Rfl: 2   amLODipine (NORVASC) 2.5 MG tablet, Take 1 tablet (2.5 mg total) by mouth daily., Disp: 90 tablet, Rfl: 3   cetirizine (ZYRTEC) 10 MG tablet, Take 10 mg by mouth daily., Disp: , Rfl:     Lactobacillus (PROBIOTIC ACIDOPHILUS PO), Take 1 capsule by mouth daily., Disp: , Rfl:    Multiple Vitamin (MULTIVITAMINS PO), Take 1 tablet by mouth once a day, Disp: , Rfl:    Vitamin D, Cholecalciferol, 50 MCG (2000 UT) CAPS, Take by mouth daily., Disp: , Rfl:    albuterol (PROVENTIL) (2.5 MG/3ML) 0.083% nebulizer solution, Take 3 mLs (2.5 mg total) by nebulization every 4 (four) hours as needed for wheezing or shortness of breath., Disp: 75 mL, Rfl: 11   BREO ELLIPTA 200-25 MCG/ACT AEPB, Inhale 1 puff by mouth once daily, Disp: 60 each, Rfl: 5   montelukast (SINGULAIR) 10 MG tablet, Take 1 tablet (10 mg total) by mouth daily., Disp: 90 tablet, Rfl: 2   Allergies  Allergen Reactions   Cephalosporins    Penicillins    Sulfa Drugs Cross Reactors     itching     Review of Systems  Constitutional: Negative.   Respiratory: Negative.    Cardiovascular: Negative.   Neurological: Negative.   Psychiatric/Behavioral: Negative.       Today's Vitals   08/26/22 0842  BP: 126/82  Pulse: 86  Temp: 97.7 F (36.5 C)  SpO2: 98%  Weight: 196 lb 9.6 oz (89.2 kg)  Height: 5' 4"$  (1.626 m)   Body mass index is 33.75 kg/m.  Wt Readings from Last 3 Encounters:  08/26/22 196 lb 9.6 oz (89.2 kg)  02/23/22 187 lb 6.4 oz (85 kg)  11/11/21 186 lb (84.4 kg)    Objective:  Physical Exam Vitals and nursing note reviewed.  Constitutional:      Appearance: Normal appearance.  HENT:     Head: Normocephalic and atraumatic.     Nose:     Comments: Masked     Mouth/Throat:     Comments: Masked  Eyes:     Extraocular Movements: Extraocular movements intact.  Cardiovascular:     Rate and Rhythm: Normal rate and regular rhythm.     Heart sounds: Normal heart sounds.  Pulmonary:     Effort: Pulmonary effort is normal.     Breath sounds: Normal breath sounds.  Musculoskeletal:     Cervical back: Normal range of motion.  Skin:    General: Skin is warm.  Neurological:     General: No focal  deficit present.     Mental Status: She is alert.  Psychiatric:        Mood and Affect: Mood normal.        Behavior: Behavior normal.      Assessment And Plan:     1. Essential hypertension, benign Comments: Chronic, controlled. She will c/w amlodipine 2.31m daily. Encouraged to follow low sodium diet. I will check renal function today. F/u 6 months for CPE. - CMP14+EGFR  2. Mild intermittent asthma without complication Comments: Chronic, stable. Refills for albuterol/Breo sent to pharmacy. Encouraged to avoid triggers.  3. Other abnormal glucose Comments: Her a1c has been elevated in the past. I will recheck an a1c today. Encouraged to limit her intake of sugary foods/beverages. - Hemoglobin A1c  4. Class 1 obesity due to excess calories with serious comorbidity and body mass index (BMI) of 33.0 to 33.9 in adult She is encouraged to strive for BMI less than 30 to decrease cardiac risk. Advised to aim for at least 150 minutes of exercise per week.    Patient was given opportunity to ask questions. Patient verbalized understanding of the plan and was able to repeat key elements of the plan. All questions were answered to their satisfaction.   I, RMaximino Greenland MD, have reviewed all documentation for this visit. The documentation on 08/26/22 for the exam, diagnosis, procedures, and orders are all accurate and complete.   IF YOU HAVE BEEN REFERRED TO A SPECIALIST, IT MAY TAKE 1-2 WEEKS TO SCHEDULE/PROCESS THE REFERRAL. IF YOU HAVE NOT HEARD FROM US/SPECIALIST IN TWO WEEKS, PLEASE GIVE [facility] CALL AT [phone].   THE PATIENT IS ENCOURAGED TO PRACTICE SOCIAL DISTANCING DUE TO THE COVID-19 PANDEMIC.

## 2022-08-26 NOTE — Patient Instructions (Signed)
Hypertension, Adult ?Hypertension is another name for high blood pressure. High blood pressure forces your heart to work harder to pump blood. This can cause problems over time. ?There are two numbers in a blood pressure reading. There is a top number (systolic) over a bottom number (diastolic). It is best to have a blood pressure that is below 120/80. ?What are the causes? ?The cause of this condition is not known. Some other conditions can lead to high blood pressure. ?What increases the risk? ?Some lifestyle factors can make you more likely to develop high blood pressure: ?Smoking. ?Not getting enough exercise or physical activity. ?Being overweight. ?Having too much fat, sugar, calories, or salt (sodium) in your diet. ?Drinking too much alcohol. ?Other risk factors include: ?Having any of these conditions: ?Heart disease. ?Diabetes. ?High cholesterol. ?Kidney disease. ?Obstructive sleep apnea. ?Having a family history of high blood pressure and high cholesterol. ?Age. The risk increases with age. ?Stress. ?What are the signs or symptoms? ?High blood pressure may not cause symptoms. Very high blood pressure (hypertensive crisis) may cause: ?Headache. ?Fast or uneven heartbeats (palpitations). ?Shortness of breath. ?Nosebleed. ?Vomiting or feeling like you may vomit (nauseous). ?Changes in how you see. ?Very bad chest pain. ?Feeling dizzy. ?Seizures. ?How is this treated? ?This condition is treated by making healthy lifestyle changes, such as: ?Eating healthy foods. ?Exercising more. ?Drinking less alcohol. ?Your doctor may prescribe medicine if lifestyle changes do not help enough and if: ?Your top number is above 130. ?Your bottom number is above 80. ?Your personal target blood pressure may vary. ?Follow these instructions at home: ?Eating and drinking ? ?If told, follow the DASH eating plan. To follow this plan: ?Fill one half of your plate at each meal with fruits and vegetables. ?Fill one fourth of your plate  at each meal with whole grains. Whole grains include whole-wheat pasta, brown rice, and whole-grain bread. ?Eat or drink low-fat dairy products, such as skim milk or low-fat yogurt. ?Fill one fourth of your plate at each meal with low-fat (lean) proteins. Low-fat proteins include fish, chicken without skin, eggs, beans, and tofu. ?Avoid fatty meat, cured and processed meat, or chicken with skin. ?Avoid pre-made or processed food. ?Limit the amount of salt in your diet to less than 1,500 mg each day. ?Do not drink alcohol if: ?Your doctor tells you not to drink. ?You are pregnant, may be pregnant, or are planning to become pregnant. ?If you drink alcohol: ?Limit how much you have to: ?0-1 drink a day for women. ?0-2 drinks a day for men. ?Know how much alcohol is in your drink. In the [place] one drink equals one 12 oz bottle of beer (355 mL), one 5 oz glass of wine (148 mL), or one 1? oz glass of hard liquor (44 mL). ?Lifestyle ? ?Work with your doctor to stay at a healthy weight or to lose weight. Ask your doctor what the best weight is for you. ?Get at least 30 minutes of exercise that causes your heart to beat faster (aerobic exercise) most days of the week. This may include walking, swimming, or biking. ?Get at least 30 minutes of exercise that strengthens your muscles (resistance exercise) at least 3 days a week. This may include lifting weights or doing Pilates. ?Do not smoke or use any products that contain nicotine or tobacco. If you need help quitting, ask your doctor. ?Check your blood pressure at home as told by your doctor. ?Keep all follow-up visits. ?Medicines ?Take over-the-counter and prescription medicines   only as told by your doctor. Follow directions carefully. ?Do not skip doses of blood pressure medicine. The medicine does not work as well if you skip doses. Skipping doses also puts you at risk for problems. ?Ask your doctor about side effects or reactions to medicines that you should watch  for. ?Contact a doctor if: ?You think you are having a reaction to the medicine you are taking. ?You have headaches that keep coming back. ?You feel dizzy. ?You have swelling in your ankles. ?You have trouble with your vision. ?Get help right away if: ?You get a very bad headache. ?You start to feel mixed up (confused). ?You feel weak or numb. ?You feel faint. ?You have very bad pain in your: ?Chest. ?Belly (abdomen). ?You vomit more than once. ?You have trouble breathing. ?These symptoms may be an emergency. Get help right away. Call 911. ?Do not wait to see if the symptoms will go away. ?Do not drive yourself to the hospital. ?Summary ?Hypertension is another name for high blood pressure. ?High blood pressure forces your heart to work harder to pump blood. ?For most people, a normal blood pressure is less than 120/80. ?Making healthy choices can help lower blood pressure. If your blood pressure does not get lower with healthy choices, you may need to take medicine. ?This information is not intended to replace advice given to you by your health care provider. Make sure you discuss any questions you have with your health care provider. ?Document Revised: 04/17/2021 Document Reviewed: 04/17/2021 ?Elsevier Patient Education ? 2023 [employer] ? ?

## 2022-08-27 LAB — CMP14+EGFR
ALT: 24 [IU]/L (ref 0–32)
AST: 24 [IU]/L (ref 0–40)
Albumin/Globulin Ratio: 1.6 (ref 1.2–2.2)
Albumin: 4.3 g/dL (ref 3.8–4.9)
Alkaline Phosphatase: 103 [IU]/L (ref 44–121)
BUN/Creatinine Ratio: 18 (ref 9–23)
BUN: 18 mg/dL (ref 6–24)
Bilirubin Total: 0.6 mg/dL (ref 0.0–1.2)
CO2: 24 mmol/L (ref 20–29)
Calcium: 9.3 mg/dL (ref 8.7–10.2)
Chloride: 105 mmol/L (ref 96–106)
Creatinine, Ser: 1.02 mg/dL — ABNORMAL HIGH (ref 0.57–1.00)
Globulin, Total: 2.7 g/dL (ref 1.5–4.5)
Glucose: 131 mg/dL — ABNORMAL HIGH (ref 70–99)
Potassium: 4 mmol/L (ref 3.5–5.2)
Sodium: 143 mmol/L (ref 134–144)
Total Protein: 7 g/dL (ref 6.0–8.5)
eGFR: 66 mL/min/{1.73_m2}

## 2022-08-27 LAB — HEMOGLOBIN A1C
Est. average glucose Bld gHb Est-mCnc: 128 mg/dL
Hgb A1c MFr Bld: 6.1 % — ABNORMAL HIGH (ref 4.8–5.6)

## 2022-10-31 ENCOUNTER — Other Ambulatory Visit (HOSPITAL_BASED_OUTPATIENT_CLINIC_OR_DEPARTMENT_OTHER): Payer: Self-pay | Admitting: Cardiovascular Disease

## 2022-10-31 DIAGNOSIS — I10 Essential (primary) hypertension: Secondary | ICD-10-CM

## 2022-10-31 DIAGNOSIS — R030 Elevated blood-pressure reading, without diagnosis of hypertension: Secondary | ICD-10-CM

## 2022-11-02 ENCOUNTER — Other Ambulatory Visit: Payer: Self-pay

## 2022-11-02 DIAGNOSIS — I10 Essential (primary) hypertension: Secondary | ICD-10-CM

## 2022-11-02 DIAGNOSIS — R030 Elevated blood-pressure reading, without diagnosis of hypertension: Secondary | ICD-10-CM

## 2022-11-02 MED ORDER — AMLODIPINE BESYLATE 2.5 MG PO TABS
2.5000 mg | ORAL_TABLET | Freq: Every day | ORAL | 0 refills | Status: DC
Start: 2022-11-02 — End: 2022-12-29

## 2022-11-02 NOTE — Telephone Encounter (Signed)
Rx(s) sent to pharmacy electronically.  

## 2022-11-23 ENCOUNTER — Other Ambulatory Visit: Payer: Self-pay | Admitting: Obstetrics and Gynecology

## 2022-11-23 DIAGNOSIS — Z1231 Encounter for screening mammogram for malignant neoplasm of breast: Secondary | ICD-10-CM

## 2022-12-23 ENCOUNTER — Ambulatory Visit: Payer: BC Managed Care – PPO | Admitting: Internal Medicine

## 2022-12-29 ENCOUNTER — Ambulatory Visit: Payer: BC Managed Care – PPO | Admitting: Internal Medicine

## 2022-12-29 ENCOUNTER — Encounter: Payer: Self-pay | Admitting: Internal Medicine

## 2022-12-29 VITALS — BP 120/70 | HR 63 | Temp 97.9°F | Ht 64.0 in | Wt 194.0 lb

## 2022-12-29 DIAGNOSIS — E6609 Other obesity due to excess calories: Secondary | ICD-10-CM | POA: Diagnosis not present

## 2022-12-29 DIAGNOSIS — Z6833 Body mass index (BMI) 33.0-33.9, adult: Secondary | ICD-10-CM | POA: Diagnosis not present

## 2022-12-29 DIAGNOSIS — I10 Essential (primary) hypertension: Secondary | ICD-10-CM

## 2022-12-29 DIAGNOSIS — R030 Elevated blood-pressure reading, without diagnosis of hypertension: Secondary | ICD-10-CM

## 2022-12-29 DIAGNOSIS — R7309 Other abnormal glucose: Secondary | ICD-10-CM

## 2022-12-29 DIAGNOSIS — J4520 Mild intermittent asthma, uncomplicated: Secondary | ICD-10-CM | POA: Diagnosis not present

## 2022-12-29 MED ORDER — AMLODIPINE BESYLATE 2.5 MG PO TABS: 2.5000 mg | ORAL_TABLET | Freq: Every day | ORAL | 2 refills | Status: DC

## 2022-12-29 MED ORDER — BREO ELLIPTA 200-25 MCG/ACT IN AEPB: INHALATION_SPRAY | RESPIRATORY_TRACT | 5 refills | Status: DC

## 2022-12-29 MED ORDER — MONTELUKAST SODIUM 10 MG PO TABS: 10.0000 mg | ORAL_TABLET | Freq: Every day | ORAL | 2 refills | Status: DC

## 2022-12-29 NOTE — Progress Notes (Unsigned)
I,Jameka J Llittleton, CMA,acting as a Neurosurgeon for [name], MD.,have documented all relevant documentation on the behalf of [name], MD,as directed by  [name], MD while in the presence of [name], MD.  Subjective:  Patient ID: Lauren Carroll , female    DOB: 09/12/1970 , 52 y.o.   MRN: [id number]  Chief Complaint  Patient presents with   Hypertension    HPI  Patient presents today for HTN f/u. She reports compliance with meds. She denies headaches, chest pain and shortness of breath. She has no specific concerns or complaints.   Patient reports having PAP in 2023.   Hypertension This is a chronic problem. The current episode started more than 1 year ago. The problem is unchanged. The problem is controlled. Past treatments include calcium channel blockers. The current treatment provides moderate improvement. There are no compliance problems.  There is no history of kidney disease, left ventricular hypertrophy or retinopathy.     Past Medical History:  Diagnosis Date   Allergic rhinitis    Asthma    Asthmatic bronchitis    Elevated blood pressure reading 09/01/2020   History of URI (upper respiratory infection)    Overactive bladder    Palpitations 09/01/2020   Syncope and collapse 09/01/2020     Family History  Problem Relation Age of Onset   Hypertension Mother    Diabetes Father    Hypertension Father    Coronary artery disease Father    Heart attack Father    Hypertension Sister    Hypertension Brother    Transient ischemic attack Maternal Grandmother      Current Outpatient Medications:    albuterol (PROVENTIL) (2.5 MG/3ML) 0.083% nebulizer solution, Take 3 mLs (2.5 mg total) by nebulization every 4 (four) hours as needed for wheezing or shortness of breath., Disp: 75 mL, Rfl: 11   albuterol (VENTOLIN HFA) 108 (90 Base) MCG/ACT inhaler, INHALE 2 PUFFS BY MOUTH EVERY 6 HOURS AS NEEDED FOR WHEEZING OR  SHORTNESS  OF  BREATH, Disp: 54 g,  Rfl: 2   cetirizine (ZYRTEC) 10 MG tablet, Take 10 mg by mouth daily., Disp: , Rfl:    Lactobacillus (PROBIOTIC ACIDOPHILUS PO), Take 1 capsule by mouth daily., Disp: , Rfl:    Multiple Vitamin (MULTIVITAMINS PO), Take 1 tablet by mouth once a day, Disp: , Rfl:    Vitamin D, Cholecalciferol, 50 MCG (2000 UT) CAPS, Take by mouth daily., Disp: , Rfl:    amLODipine (NORVASC) 2.5 MG tablet, Take 1 tablet (2.5 mg total) by mouth daily., Disp: 90 tablet, Rfl: 2   BREO ELLIPTA 200-25 MCG/ACT AEPB, Inhale 1 puff by mouth once daily, Disp: 60 each, Rfl: 5   montelukast (SINGULAIR) 10 MG tablet, Take 1 tablet (10 mg total) by mouth daily., Disp: 90 tablet, Rfl: 2   Allergies  Allergen Reactions   Cephalosporins    Penicillins    Sulfa Drugs Cross Reactors     itching     Review of Systems  Constitutional: Negative.   Eyes: Negative.   Respiratory: Negative.    Cardiovascular: Negative.   Gastrointestinal: Negative.   Musculoskeletal: Negative.   Skin: Negative.   Neurological: Negative.   Psychiatric/Behavioral: Negative.       Today's Vitals   12/29/22 1410 12/29/22 1446  BP: 130/80 120/70  Pulse: 63   Temp: 97.9 F (36.6 C)   Weight: 194 lb (88 kg)   Height: 5\' 4"  (1.626 m)   PainSc:  0-No pain    Body mass index is 33.3 kg/m.  Wt Readings from Last 3 Encounters:  12/29/22 194 lb (88 kg)  08/26/22 196 lb 9.6 oz (89.2 kg)  02/23/22 187 lb 6.4 oz (85 kg)    The 10-year ASCVD risk score (Arnett DK, et al., 2019) is: 2.2%   Values used to calculate the score:     Age: 52 years     Sex: Female     Is Non-Hispanic African American: Yes     Diabetic: No     Tobacco smoker: No     Systolic Blood Pressure: 120 mmHg     Is BP treated: Yes     HDL Cholesterol: 81 mg/dL     Total Cholesterol: 205 mg/dL  Objective:  Physical Exam Vitals and nursing note reviewed.  Constitutional:      Appearance: Normal appearance.  HENT:     Head: Normocephalic and atraumatic.  Eyes:      Extraocular Movements: Extraocular movements intact.  Cardiovascular:     Rate and Rhythm: Normal rate and regular rhythm.     Heart sounds: Normal heart sounds.  Pulmonary:     Effort: Pulmonary effort is normal.     Breath sounds: Normal breath sounds.  Musculoskeletal:     Cervical back: Normal range of motion.  Skin:    General: Skin is warm.  Neurological:     General: No focal deficit present.     Mental Status: She is alert.  Psychiatric:        Mood and Affect: Mood normal.        Behavior: Behavior normal.         Assessment And Plan:  1. Essential hypertension, benign Comments: Chronic, well controlled on amlodipine 2.5mg  daily.  She is encouraged to follow a low sodium diet. Will defer labs since she is scheduled in a few months for CPE. - amLODipine (NORVASC) 2.5 MG tablet; Take 1 tablet (2.5 mg total) by mouth daily.  Dispense: 90 tablet; Refill: 2  2. Mild intermittent asthma without complication Comments: Chronic, I will send refills for Breo and montelukast. - BREO ELLIPTA 200-25 MCG/ACT AEPB; Inhale 1 puff by mouth once daily  Dispense: 60 each; Refill: 5 - montelukast (SINGULAIR) 10 MG tablet; Take 1 tablet (10 mg total) by mouth daily.  Dispense: 90 tablet; Refill: 2  3. Class 1 obesity due to excess calories with serious comorbidity and body mass index (BMI) of 33.0 to 33.9 in adult  She is encouraged to strive for BMI less than 30 to decrease cardiac risk. Advised to aim for at least 150 minutes of exercise per week.   Return for keep next appt as scheduled .  Patient was given opportunity to ask questions. Patient verbalized understanding of the plan and was able to repeat key elements of the plan. All questions were answered to their satisfaction.    I, [name], MD, have reviewed all documentation for this visit. The documentation on 12/29/22 for the exam, diagnosis, procedures, and orders are all accurate and complete.   IF YOU HAVE BEEN  REFERRED TO A SPECIALIST, IT MAY TAKE 1-2 WEEKS TO SCHEDULE/PROCESS THE REFERRAL. IF YOU HAVE NOT HEARD FROM US/SPECIALIST IN TWO WEEKS, PLEASE GIVE [place] A CALL AT [phone].

## 2023-01-15 ENCOUNTER — Ambulatory Visit
Admission: RE | Admit: 2023-01-15 | Discharge: 2023-01-15 | Disposition: A | Discharge status: Home / Self Care | Payer: BC Managed Care – PPO | Source: Ambulatory Visit | Attending: Obstetrics and Gynecology | Admitting: Obstetrics and Gynecology

## 2023-01-15 DIAGNOSIS — Z1231 Encounter for screening mammogram for malignant neoplasm of breast: Secondary | ICD-10-CM

## 2023-02-12 LAB — HM PAP SMEAR
HM Pap smear: POSITIVE
HPV, high-risk: NEGATIVE

## 2023-03-01 ENCOUNTER — Encounter: Payer: Self-pay | Admitting: Internal Medicine

## 2023-03-02 ENCOUNTER — Encounter: Payer: BC Managed Care – PPO | Admitting: Internal Medicine

## 2023-03-02 NOTE — Progress Notes (Deleted)
I,Mckennon Zwart T Deloria Lair, CMA,acting as a Neurosurgeon for [name], MD.,have documented all relevant documentation on the behalf of [name], MD,as directed by  [name], MD while in the presence of [name], MD.  Subjective:    Patient ID: Lauren Carroll , female    DOB: 02-10-1971 , 52 y.o.   MRN: [id number]  No chief complaint on file.   HPI  Patient presents today for HM. She is followed by Dr. Cherly Hensen for her GYN exams. Patient reports compliance with medications. She denies having any headaches, chest pain and shortness of breath. Patient has no other issues today.     Hypertension This is a chronic problem. The current episode started more than 1 year ago. The problem is unchanged. The problem is controlled. Past treatments include calcium channel blockers. The current treatment provides moderate improvement. There are no compliance problems.     Past Medical History:  Diagnosis Date  . Allergic rhinitis   . Asthma   . Asthmatic bronchitis   . Elevated blood pressure reading 09/01/2020  . History of URI (upper respiratory infection)   . Overactive bladder   . Palpitations 09/01/2020  . Syncope and collapse 09/01/2020     Family History  Problem Relation Age of Onset  . Hypertension Mother   . Diabetes Father   . Hypertension Father   . Coronary artery disease Father   . Heart attack Father   . Hypertension Sister   . Hypertension Brother   . Transient ischemic attack Maternal Grandmother      Current Outpatient Medications:  .  albuterol (PROVENTIL) (2.5 MG/3ML) 0.083% nebulizer solution, Take 3 mLs (2.5 mg total) by nebulization every 4 (four) hours as needed for wheezing or shortness of breath., Disp: 75 mL, Rfl: 11 .  albuterol (VENTOLIN HFA) 108 (90 Base) MCG/ACT inhaler, INHALE 2 PUFFS BY MOUTH EVERY 6 HOURS AS NEEDED FOR WHEEZING OR  SHORTNESS  OF  BREATH, Disp: 54 g, Rfl: 2 .  amLODipine (NORVASC) 2.5 MG tablet, Take 1 tablet (2.5 mg total)  by mouth daily., Disp: 90 tablet, Rfl: 2 .  BREO ELLIPTA 200-25 MCG/ACT AEPB, Inhale 1 puff by mouth once daily, Disp: 60 each, Rfl: 5 .  cetirizine (ZYRTEC) 10 MG tablet, Take 10 mg by mouth daily., Disp: , Rfl:  .  Lactobacillus (PROBIOTIC ACIDOPHILUS PO), Take 1 capsule by mouth daily., Disp: , Rfl:  .  montelukast (SINGULAIR) 10 MG tablet, Take 1 tablet (10 mg total) by mouth daily., Disp: 90 tablet, Rfl: 2 .  Multiple Vitamin (MULTIVITAMINS PO), Take 1 tablet by mouth once a day, Disp: , Rfl:  .  Vitamin D, Cholecalciferol, 50 MCG (2000 UT) CAPS, Take by mouth daily., Disp: , Rfl:    Allergies  Allergen Reactions  . Cephalosporins   . Penicillins   . Sulfa Drugs Cross Reactors     itching      The patient states she uses {contraceptive methods:5051} for birth control. No LMP recorded. (Menstrual status: Perimenopausal).. {Dysmenorrhea-menorrhagia:21918}. Negative for: breast discharge, breast lump(s), breast pain and breast self exam. Associated symptoms include abnormal vaginal bleeding. Pertinent negatives include abnormal bleeding (hematology), anxiety, decreased libido, depression, difficulty falling sleep, dyspareunia, history of infertility, nocturia, sexual dysfunction, sleep disturbances, urinary incontinence, urinary urgency, vaginal discharge and vaginal itching. Diet regular.The patient states her exercise level is    . The patient's tobacco use is:  Social History   Tobacco Use  Smoking Status Never  Smokeless Tobacco Never  . She has been exposed to passive smoke. The patient's alcohol use is:  Social History   Substance and Sexual Activity  Alcohol Use No  . Additional information: Last pap ***, next one scheduled for ***.    Review of Systems  Constitutional: Negative.   Respiratory: Negative.    Cardiovascular: Negative.   Neurological: Negative.   Psychiatric/Behavioral: Negative.       There were no vitals filed for this visit. There is no height or  weight on file to calculate BMI.  Wt Readings from Last 3 Encounters:  12/29/22 194 lb (88 kg)  08/26/22 196 lb 9.6 oz (89.2 kg)  02/23/22 187 lb 6.4 oz (85 kg)     Objective:  Physical Exam      Assessment And Plan:     Annual physical exam  Essential hypertension, benign  Other abnormal glucose  Mild intermittent asthma without complication  Vitamin D deficiency     No follow-ups on file. Patient was given opportunity to ask questions. Patient verbalized understanding of the plan and was able to repeat key elements of the plan. All questions were answered to their satisfaction.   [name], MD  I, [name], MD, have reviewed all documentation for this visit. The documentation on 03/02/23 for the exam, diagnosis, procedures, and orders are all accurate and complete.

## 2023-03-03 ENCOUNTER — Other Ambulatory Visit: Payer: Self-pay | Admitting: Internal Medicine

## 2023-03-23 ENCOUNTER — Encounter: Payer: Self-pay | Admitting: Internal Medicine

## 2023-03-23 ENCOUNTER — Ambulatory Visit (INDEPENDENT_AMBULATORY_CARE_PROVIDER_SITE_OTHER): Payer: BC Managed Care – PPO | Admitting: Internal Medicine

## 2023-03-23 VITALS — BP 120/78 | HR 67 | Temp 97.8°F | Ht 64.0 in | Wt 188.6 lb

## 2023-03-23 DIAGNOSIS — J4520 Mild intermittent asthma, uncomplicated: Secondary | ICD-10-CM | POA: Diagnosis not present

## 2023-03-23 DIAGNOSIS — Z23 Encounter for immunization: Secondary | ICD-10-CM

## 2023-03-23 DIAGNOSIS — K219 Gastro-esophageal reflux disease without esophagitis: Secondary | ICD-10-CM | POA: Insufficient documentation

## 2023-03-23 DIAGNOSIS — Z0000 Encounter for general adult medical examination without abnormal findings: Secondary | ICD-10-CM | POA: Insufficient documentation

## 2023-03-23 DIAGNOSIS — E66811 Obesity, class 1: Secondary | ICD-10-CM | POA: Insufficient documentation

## 2023-03-23 DIAGNOSIS — I10 Essential (primary) hypertension: Secondary | ICD-10-CM | POA: Diagnosis not present

## 2023-03-23 DIAGNOSIS — E6609 Other obesity due to excess calories: Secondary | ICD-10-CM | POA: Diagnosis not present

## 2023-03-23 DIAGNOSIS — Z6832 Body mass index (BMI) 32.0-32.9, adult: Secondary | ICD-10-CM

## 2023-03-23 LAB — POCT URINALYSIS DIPSTICK
Bilirubin, UA: NEGATIVE
Glucose, UA: NEGATIVE
Ketones, UA: NEGATIVE
Leukocytes, UA: NEGATIVE
Nitrite, UA: NEGATIVE
Protein, UA: NEGATIVE
Spec Grav, UA: >=1.03 — AB (ref 1.010–1.025)
Urobilinogen, UA: 0.2 U/dL
pH, UA: 6 (ref 5.0–8.0)

## 2023-03-23 MED ORDER — ALBUTEROL SULFATE HFA 108 (90 BASE) MCG/ACT IN AERS: INHALATION_SPRAY | RESPIRATORY_TRACT | 2 refills | Status: AC

## 2023-03-23 MED ORDER — OMEPRAZOLE 40 MG PO CPDR: 40.0000 mg | DELAYED_RELEASE_CAPSULE | Freq: Every day | ORAL | 1 refills | Status: AC

## 2023-03-23 NOTE — Patient Instructions (Signed)

## 2023-03-23 NOTE — Assessment & Plan Note (Signed)
Chronic, Most recent Pulmonary note reviewed, does not support previous diagnosis of asthma. Pt feels better while on Breo. Will continue with this medication for now. She agrees to consider titration once her GERD sx are better controlled.

## 2023-03-23 NOTE — Assessment & Plan Note (Signed)
Chronic, controlled.  EKG performed, NSR w/ LAE.  She will continue with amlodipine 2.5mg  daily. She is encouraged to follow a low sodium diet. I will check renal function today. She will rto in four to six months for re-evaluation.

## 2023-03-23 NOTE — Assessment & Plan Note (Signed)
She is encouraged to strive for BMI less than 30 to decrease cardiac risk. Advised to aim for at least 150 minutes of exercise per week.  

## 2023-03-23 NOTE — Assessment & Plan Note (Signed)

## 2023-03-23 NOTE — Assessment & Plan Note (Addendum)
This is likely exacerbating her asthma symptoms. I will start omeprazole 40mg  daily x 90 days. After that, I will try to titrate her dose to 3x/week dosing. She is in agreement with her treatment plan.

## 2023-03-23 NOTE — Progress Notes (Signed)
I,Lauren Carroll, CMA,acting as a Neurosurgeon for Lauren Aliment, MD.,have documented all relevant documentation on the behalf of Lauren Aliment, MD,as directed by  Lauren Aliment, MD while in the presence of Lauren Aliment, MD.  Subjective:    Patient ID: Lauren Carroll , female    DOB: [birth year] , 52 y.o.   MRN: [id number]  Chief Complaint  Patient presents with   Annual Exam   Hypertension    HPI  Patient presents today for annual exam. She is followed by Dr. Cherly Hensen for her GYN exams. Patient reports compliance with medications. She denies having any headaches, chest pain and shortness of breath. Patient has no other issues today.   Letter sent to GYN for pap result.        Hypertension This is a chronic problem. The current episode started more than 1 year ago. The problem is unchanged. The problem is controlled. Past treatments include calcium channel blockers. The current treatment provides moderate improvement. There are no compliance problems.      Past Medical History:  Diagnosis Date   Allergic rhinitis    Asthma    Asthmatic bronchitis    Elevated blood pressure reading 09/01/2020   History of URI (upper respiratory infection)    Overactive bladder    Palpitations 09/01/2020   Syncope and collapse 09/01/2020     Family History  Problem Relation Age of Onset   Hypertension Mother    Diabetes Father    Hypertension Father    Coronary artery disease Father    Heart attack Father    Hypertension Sister    Hypertension Brother    Transient ischemic attack Maternal Grandmother      Current Outpatient Medications:    albuterol (PROVENTIL) (2.5 MG/3ML) 0.083% nebulizer solution, Take 3 mLs (2.5 mg total) by nebulization every 4 (four) hours as needed for wheezing or shortness of breath., Disp: 75 mL, Rfl: 11   amLODipine (NORVASC) 2.5 MG tablet, Take 1 tablet (2.5 mg total) by mouth daily., Disp: 90 tablet, Rfl: 2   BREO ELLIPTA 200-25 MCG/ACT AEPB, Inhale  1 puff by mouth once daily, Disp: 60 each, Rfl: 5   cetirizine (ZYRTEC) 10 MG tablet, Take 10 mg by mouth daily., Disp: , Rfl:    Lactobacillus (PROBIOTIC ACIDOPHILUS PO), Take 1 capsule by mouth daily., Disp: , Rfl:    montelukast (SINGULAIR) 10 MG tablet, Take 1 tablet (10 mg total) by mouth daily., Disp: 90 tablet, Rfl: 2   Multiple Vitamin (MULTIVITAMINS PO), Take 1 tablet by mouth once a day, Disp: , Rfl:    omeprazole (PRILOSEC) 40 MG capsule, Take 1 capsule (40 mg total) by mouth daily., Disp: 90 capsule, Rfl: 1   Vitamin D, Cholecalciferol, 50 MCG (2000 UT) CAPS, Take by mouth daily., Disp: , Rfl:    albuterol (VENTOLIN HFA) 108 (90 Base) MCG/ACT inhaler, INHALE 2 PUFFS BY MOUTH EVERY 6 HOURS AS NEEDED FOR WHEEZING FOR SHORTNESS OF BREATH, Disp: 54 g, Rfl: 2   Allergies  Allergen Reactions   Cephalosporins    Penicillins    Sulfa Drugs Cross Reactors     itching      The patient states she uses none for birth control. Patient's last menstrual period was 01/08/2023 (approximate).. Negative for Dysmenorrhea. Negative for: breast discharge, breast lump(s), breast pain and breast self exam. Associated symptoms include abnormal vaginal bleeding. Pertinent negatives include abnormal bleeding (hematology), anxiety, decreased libido, depression, difficulty falling sleep, dyspareunia, history of infertility,  nocturia, sexual dysfunction, sleep disturbances, urinary incontinence, urinary urgency, vaginal discharge and vaginal itching. Diet regular.The patient states her exercise level is  moderate.   . The patient's tobacco use is:  Social History   Tobacco Use  Smoking Status Never  Smokeless Tobacco Never  . She has been exposed to passive smoke. The patient's alcohol use is:  Social History   Substance and Sexual Activity  Alcohol Use No      Review of Systems  Constitutional: Negative.   HENT: Negative.    Eyes: Negative.   Respiratory: Negative.    Cardiovascular: Negative.    Gastrointestinal: Negative.   Endocrine: Negative.   Genitourinary: Negative.   Musculoskeletal: Negative.   Skin: Negative.   Allergic/Immunologic: Negative.   Neurological: Negative.   Hematological: Negative.   Psychiatric/Behavioral: Negative.       Today's Vitals   03/23/23 0849  BP: 120/78  Pulse: 67  Temp: 97.8 F (36.6 C)  SpO2: 98%  Weight: 188 lb 9.6 oz (85.5 kg)  Height: 5\' 4"  (1.626 m)   Body mass index is 32.37 kg/m.  Wt Readings from Last 3 Encounters:  03/23/23 188 lb 9.6 oz (85.5 kg)  12/29/22 194 lb (88 kg)  08/26/22 196 lb 9.6 oz (89.2 kg)     Objective:  Physical Exam Vitals and nursing note reviewed.  Constitutional:      Appearance: Normal appearance.  HENT:     Head: Normocephalic and atraumatic.     Right Ear: Tympanic membrane, ear canal and external ear normal.     Left Ear: Tympanic membrane, ear canal and external ear normal.     Nose: Nose normal.     Mouth/Throat:     Mouth: Mucous membranes are moist.     Pharynx: Oropharynx is clear.  Eyes:     Extraocular Movements: Extraocular movements intact.     Conjunctiva/sclera: Conjunctivae normal.     Pupils: Pupils are equal, round, and reactive to light.  Cardiovascular:     Rate and Rhythm: Normal rate and regular rhythm.     Pulses: Normal pulses.     Heart sounds: Normal heart sounds.  Pulmonary:     Effort: Pulmonary effort is normal.     Breath sounds: Normal breath sounds.  Chest:  Breasts:    Tanner Score is 5.     Right: Normal.     Left: Normal.  Abdominal:     General: Abdomen is flat. Bowel sounds are normal.     Palpations: Abdomen is soft.  Genitourinary:    Comments: deferred Musculoskeletal:        General: Normal range of motion.     Cervical back: Normal range of motion and neck supple.  Skin:    General: Skin is warm and dry.  Neurological:     General: No focal deficit present.     Mental Status: She is alert and oriented to person, place, and time.   Psychiatric:        Mood and Affect: Mood normal.        Behavior: Behavior normal.         Assessment And Plan:     Annual physical exam Assessment & Plan: A full exam was performed.  Importance of monthly self breast exams was discussed with the patient.  She is advised to get 30-45 minutes of regular exercise, no less than four to five days per week. Both weight-bearing and aerobic exercises are recommended.  She is advised to  follow a healthy diet with at least six fruits/veggies per day, decrease intake of red meat and other saturated fats and to increase fish intake to twice weekly.  Meats/fish should not be fried -- baked, boiled or broiled is preferable. It is also important to cut back on your sugar intake.  Be sure to read labels - try to avoid anything with added sugar, high fructose corn syrup or other sweeteners.  If you must use a sweetener, you can try stevia or monkfruit.  It is also important to avoid artificially sweetened foods/beverages and diet drinks. Lastly, wear SPF 50 sunscreen on exposed skin and when in direct sunlight for an extended period of time.  Be sure to avoid fast food restaurants and aim for at least 60 ounces of water daily.      Orders: -     CBC -     CMP14+EGFR -     Lipid panel -     Hemoglobin A1c -     TSH  Essential hypertension, benign Assessment & Plan: Chronic, controlled.  EKG performed, NSR w/ LAE.  She will continue with amlodipine 2.5mg  daily. She is encouraged to follow a low sodium diet. I will check renal function today. She will rto in four to six months for re-evaluation.   Orders: -     POCT urinalysis dipstick -     Microalbumin / creatinine urine ratio -     EKG 12-Lead  Mild intermittent asthma without complication Assessment & Plan: Chronic, Most recent Pulmonary note reviewed, does not support previous diagnosis of asthma. Pt feels better while on Breo. Will continue with this medication for now. She agrees to consider  titration once her GERD sx are better controlled.    Gastroesophageal reflux disease without esophagitis Assessment & Plan: This is likely exacerbating her asthma symptoms. I will start omeprazole 40mg  daily x 90 days. After that, I will try to titrate her dose to 3x/week dosing. She is in agreement with her treatment plan.    Class 1 obesity due to excess calories with serious comorbidity and body mass index (BMI) of 32.0 to 32.9 in adult Assessment & Plan: She is encouraged to strive for BMI less than 30 to decrease cardiac risk. Advised to aim for at least 150 minutes of exercise per week.    Immunization due -     Flu vaccine trivalent PF, 6mos and older(Flulaval,Afluria,Fluarix,Fluzone)  Other orders -     Albuterol Sulfate HFA; INHALE 2 PUFFS BY MOUTH EVERY 6 HOURS AS NEEDED FOR WHEEZING FOR SHORTNESS OF BREATH  Dispense: 54 g; Refill: 2 -     Omeprazole; Take 1 capsule (40 mg total) by mouth daily.  Dispense: 90 capsule; Refill: 1     No follow-ups on file. Patient was given opportunity to ask questions. Patient verbalized understanding of the plan and was able to repeat key elements of the plan. All questions were answered to their satisfaction.    I, Lauren Aliment, MD, have reviewed all documentation for this visit. The documentation on 03/23/23 for the exam, diagnosis, procedures, and orders are all accurate and complete.

## 2023-03-24 LAB — CMP14+EGFR
ALT: 30 IU/L (ref 0–32)
AST: 27 IU/L (ref 0–40)
Albumin: 4.3 g/dL (ref 3.8–4.9)
Alkaline Phosphatase: 84 IU/L (ref 44–121)
BUN/Creatinine Ratio: 15 (ref 9–23)
BUN: 12 mg/dL (ref 6–24)
Bilirubin Total: 0.6 mg/dL (ref 0.0–1.2)
CO2: 25 mmol/L (ref 20–29)
Calcium: 9.7 mg/dL (ref 8.7–10.2)
Chloride: 103 mmol/L (ref 96–106)
Creatinine, Ser: 0.8 mg/dL (ref 0.57–1.00)
Globulin, Total: 2.9 g/dL (ref 1.5–4.5)
Glucose: 90 mg/dL (ref 70–99)
Potassium: 4.6 mmol/L (ref 3.5–5.2)
Sodium: 140 mmol/L (ref 134–144)
Total Protein: 7.2 g/dL (ref 6.0–8.5)
eGFR: 88 mL/min/{1.73_m2} (ref >59)

## 2023-03-24 LAB — CBC
Hematocrit: 40.6 % (ref 34.0–46.6)
Hemoglobin: 13.5 g/dL (ref 11.1–15.9)
MCH: 30.9 pg (ref 26.6–33.0)
MCHC: 33.3 g/dL (ref 31.5–35.7)
MCV: 93 fL (ref 79–97)
Platelets: 315 10*3/uL (ref 150–450)
RBC: 4.37 x10E6/uL (ref 3.77–5.28)
RDW: 11.8 % (ref 11.7–15.4)
WBC: 6.3 10*3/uL (ref 3.4–10.8)

## 2023-03-24 LAB — LIPID PANEL
Chol/HDL Ratio: 2.6 ratio (ref 0.0–4.4)
Cholesterol, Total: 212 mg/dL — ABNORMAL HIGH (ref 100–199)
HDL: 82 mg/dL (ref >39)
LDL Chol Calc (NIH): 118 mg/dL — ABNORMAL HIGH (ref 0–99)
Triglycerides: 70 mg/dL (ref 0–149)
VLDL Cholesterol Cal: 12 mg/dL (ref 5–40)

## 2023-03-24 LAB — MICROALBUMIN / CREATININE URINE RATIO
Creatinine, Urine: 212.4 mg/dL
Microalb/Creat Ratio: 4 mg/g{creat} (ref 0–29)
Microalbumin, Urine: 9.5 ug/mL

## 2023-03-24 LAB — HEMOGLOBIN A1C
Est. average glucose Bld gHb Est-mCnc: 134 mg/dL
Hgb A1c MFr Bld: 6.3 % — ABNORMAL HIGH (ref 4.8–5.6)

## 2023-03-24 LAB — TSH: TSH: 1.46 u[IU]/mL (ref 0.450–4.500)

## 2023-08-04 ENCOUNTER — Other Ambulatory Visit: Payer: Self-pay | Admitting: Gastroenterology

## 2023-08-04 DIAGNOSIS — R1084 Generalized abdominal pain: Secondary | ICD-10-CM

## 2023-08-05 ENCOUNTER — Encounter: Payer: Self-pay | Admitting: Gastroenterology

## 2023-08-11 ENCOUNTER — Other Ambulatory Visit: Payer: 59

## 2023-08-11 ENCOUNTER — Ambulatory Visit
Admission: RE | Admit: 2023-08-11 | Discharge: 2023-08-11 | Disposition: A | Discharge status: Home / Self Care | Payer: Self-pay | Source: Ambulatory Visit | Attending: Gastroenterology | Admitting: Gastroenterology

## 2023-08-11 DIAGNOSIS — R1084 Generalized abdominal pain: Secondary | ICD-10-CM

## 2023-08-11 MED ORDER — IOPAMIDOL (ISOVUE-300) INJECTION 61%
100.0000 mL | Freq: Once | INTRAVENOUS | Status: AC | PRN
  Administered 2023-08-11: 100 mL via INTRAVENOUS

## 2023-09-21 ENCOUNTER — Ambulatory Visit: Payer: Self-pay | Admitting: Internal Medicine

## 2023-09-21 ENCOUNTER — Encounter: Payer: Self-pay | Admitting: Internal Medicine

## 2023-09-21 VITALS — BP 122/80 | HR 85 | Temp 97.9°F | Ht 64.0 in | Wt 184.0 lb

## 2023-09-21 DIAGNOSIS — J4520 Mild intermittent asthma, uncomplicated: Secondary | ICD-10-CM

## 2023-09-21 DIAGNOSIS — E66811 Obesity, class 1: Secondary | ICD-10-CM | POA: Diagnosis not present

## 2023-09-21 DIAGNOSIS — R7309 Other abnormal glucose: Secondary | ICD-10-CM | POA: Insufficient documentation

## 2023-09-21 DIAGNOSIS — E6609 Other obesity due to excess calories: Secondary | ICD-10-CM | POA: Diagnosis not present

## 2023-09-21 DIAGNOSIS — Z2821 Immunization not carried out because of patient refusal: Secondary | ICD-10-CM

## 2023-09-21 DIAGNOSIS — I10 Essential (primary) hypertension: Secondary | ICD-10-CM

## 2023-09-21 DIAGNOSIS — Z6831 Body mass index (BMI) 31.0-31.9, adult: Secondary | ICD-10-CM

## 2023-09-21 MED ORDER — FLUTICASONE FUROATE-VILANTEROL 100-25 MCG/ACT IN AEPB: 1.0000 | INHALATION_SPRAY | Freq: Every day | RESPIRATORY_TRACT | 3 refills | Status: DC

## 2023-09-21 MED ORDER — AMLODIPINE BESYLATE 2.5 MG PO TABS: 2.5000 mg | ORAL_TABLET | Freq: Every day | ORAL | 2 refills | Status: AC

## 2023-09-21 NOTE — Progress Notes (Signed)
 I,Jameka J Llittleton, CMA,acting as a Neurosurgeon for [name], MD.,have documented all relevant documentation on the behalf of [name], MD,as directed by  [name], MD while in the presence of [name], MD.  Subjective:  Patient ID: Lauren Carroll , female    DOB: 11/01/1970 , 53 y.o.   MRN: [id number]  Chief Complaint  Patient presents with   Hypertension    HPI  Patient presents today for HTN f/u. She reports compliance with meds. She denies having any headaches, chest pain and shortness of breath.   She has been trying to do more self care. She has been trying to get 7 hours of sleep.  She is striving to incorporate more exercise into her weekly routine.  She has no specific concerns or complaints.     Hypertension This is a chronic problem. The current episode started more than 1 year ago. The problem is unchanged. The problem is controlled. Pertinent negatives include no blurred vision, chest pain or headaches. Past treatments include calcium channel blockers. The current treatment provides moderate improvement. There are no compliance problems.  There is no history of kidney disease, left ventricular hypertrophy or retinopathy.     Past Medical History:  Diagnosis Date   Allergic rhinitis    Asthma    Asthmatic bronchitis    Elevated blood pressure reading 09/01/2020   History of URI (upper respiratory infection)    Overactive bladder    Palpitations 09/01/2020   Syncope and collapse 09/01/2020     Family History  Problem Relation Age of Onset   Hypertension Mother    Diabetes Father    Hypertension Father    Coronary artery disease Father    Heart attack Father    Hypertension Sister    Hypertension Brother    Transient ischemic attack Maternal Grandmother      Current Outpatient Medications:    albuterol (PROVENTIL) (2.5 MG/3ML) 0.083% nebulizer solution, Take 3 mLs (2.5 mg total) by nebulization every 4 (four) hours as needed for  wheezing or shortness of breath., Disp: 75 mL, Rfl: 11   albuterol (VENTOLIN HFA) 108 (90 Base) MCG/ACT inhaler, INHALE 2 PUFFS BY MOUTH EVERY 6 HOURS AS NEEDED FOR WHEEZING FOR SHORTNESS OF BREATH, Disp: 54 g, Rfl: 2   cetirizine (ZYRTEC) 10 MG tablet, Take 10 mg by mouth daily., Disp: , Rfl:    fluticasone furoate-vilanterol (BREO ELLIPTA) 100-25 MCG/ACT AEPB, Inhale 1 puff into the lungs daily., Disp: 60 each, Rfl: 3   Lactobacillus (PROBIOTIC ACIDOPHILUS PO), Take 1 capsule by mouth daily., Disp: , Rfl:    montelukast (SINGULAIR) 10 MG tablet, Take 1 tablet (10 mg total) by mouth daily., Disp: 90 tablet, Rfl: 2   Multiple Vitamin (MULTIVITAMINS PO), Take 1 tablet by mouth once a day, Disp: , Rfl:    omeprazole (PRILOSEC) 40 MG capsule, Take 1 capsule (40 mg total) by mouth daily., Disp: 90 capsule, Rfl: 1   Vitamin D, Cholecalciferol, 50 MCG (2000 UT) CAPS, Take by mouth daily., Disp: , Rfl:    amLODipine (NORVASC) 2.5 MG tablet, Take 1 tablet (2.5 mg total) by mouth daily., Disp: 90 tablet, Rfl: 2   Allergies  Allergen Reactions   Cephalosporins    Penicillins    Sulfa Drugs Cross Reactors     itching     Review of Systems  Constitutional: Negative.   Eyes: Negative.  Negative for blurred vision.  Respiratory: Negative.    Cardiovascular: Negative.  Negative for chest pain.  Gastrointestinal: Negative.   Musculoskeletal: Negative.   Neurological:  Negative for headaches.  Psychiatric/Behavioral: Negative.       Today's Vitals   09/21/23 1509 09/21/23 1538  BP: (!) 140/80 122/80  Pulse: 85   Temp: 97.9 F (36.6 C)   TempSrc: Oral   Weight: 184 lb (83.5 kg)   Height: 5\' 4"  (1.626 m)   PainSc: 0-No pain    Body mass index is 31.58 kg/m.  Wt Readings from Last 3 Encounters:  09/21/23 184 lb (83.5 kg)  03/23/23 188 lb 9.6 oz (85.5 kg)  12/29/22 194 lb (88 kg)    The 10-year ASCVD risk score (Arnett DK, et al., 2019) is: 2.7%   Values used to calculate the score:      Age: 39 years     Sex: Female     Is Non-Hispanic African American: Yes     Diabetic: No     Tobacco smoker: No     Systolic Blood Pressure: 122 mmHg     Is BP treated: Yes     HDL Cholesterol: 82 mg/dL     Total Cholesterol: 212 mg/dL  Objective:  Physical Exam Vitals and nursing note reviewed.  Constitutional:      Appearance: Normal appearance.  HENT:     Head: Normocephalic and atraumatic.  Eyes:     Extraocular Movements: Extraocular movements intact.  Cardiovascular:     Rate and Rhythm: Normal rate and regular rhythm.     Heart sounds: Normal heart sounds.  Pulmonary:     Effort: Pulmonary effort is normal.     Breath sounds: Normal breath sounds.  Musculoskeletal:     Cervical back: Normal range of motion.  Skin:    General: Skin is warm.  Neurological:     General: No focal deficit present.     Mental Status: She is alert.  Psychiatric:        Mood and Affect: Mood normal.        Behavior: Behavior normal.         Assessment And Plan:  Essential hypertension, benign Assessment & Plan: Chronic, controlled.  She will continue with amlodipine 2.5mg  daily. She is encouraged to follow a low sodium diet. I will check renal function today. She will rto in four to six months for re-evaluation.   Orders: -     CMP14+EGFR -     amLODIPine Besylate; Take 1 tablet (2.5 mg total) by mouth daily.  Dispense: 90 tablet; Refill: 2  Mild intermittent asthma without complication Assessment & Plan: Chronic, Most recent Pulmonary note reviewed, does not support previous diagnosis of asthma. Pt feels better while on Breo. Will continue with this medication for now. We have previously discussed possibly titrating her off of meds once GERD sx are better controlled.     Class 1 obesity due to excess calories with serious comorbidity and body mass index (BMI) of 31.0 to 31.9 in adult Assessment & Plan: She is encouraged to strive for BMI less than 30 to decrease cardiac risk.  Advised to aim for at least 150 minutes of exercise per week.    Pneumococcal vaccination declined  Other orders -     Fluticasone Furoate-Vilanterol; Inhale 1 puff into the lungs daily.  Dispense: 60 each; Refill: 3    Return if symptoms worsen or fail to improve.  Patient was given opportunity to ask questions. Patient verbalized understanding of the plan and was able to repeat key  elements of the plan. All questions were answered to their satisfaction.    I, [name], MD, have reviewed all documentation for this visit. The documentation on 09/21/23 for the exam, diagnosis, procedures, and orders are all accurate and complete.   IF YOU HAVE BEEN REFERRED TO A SPECIALIST, IT MAY TAKE 1-2 WEEKS TO SCHEDULE/PROCESS THE REFERRAL. IF YOU HAVE NOT HEARD FROM US/SPECIALIST IN TWO WEEKS, PLEASE GIVE [place] A CALL AT [phone].

## 2023-09-21 NOTE — Patient Instructions (Signed)
 Hypertension, Adult Hypertension is another name for high blood pressure. High blood pressure forces your heart to work harder to pump blood. This can cause problems over time. There are two numbers in a blood pressure reading. There is a top number (systolic) over a bottom number (diastolic). It is best to have a blood pressure that is below 120/80. What are the causes? The cause of this condition is not known. Some other conditions can lead to high blood pressure. What increases the risk? Some lifestyle factors can make you more likely to develop high blood pressure: Smoking. Not getting enough exercise or physical activity. Being overweight. Having too much fat, sugar, calories, or salt (sodium) in your diet. Drinking too much alcohol. Other risk factors include: Having any of these conditions: Heart disease. Diabetes. High cholesterol. Kidney disease. Obstructive sleep apnea. Having a family history of high blood pressure and high cholesterol. Age. The risk increases with age. Stress. What are the signs or symptoms? High blood pressure may not cause symptoms. Very high blood pressure (hypertensive crisis) may cause: Headache. Fast or uneven heartbeats (palpitations). Shortness of breath. Nosebleed. Vomiting or feeling like you may vomit (nauseous). Changes in how you see. Very bad chest pain. Feeling dizzy. Seizures. How is this treated? This condition is treated by making healthy lifestyle changes, such as: Eating healthy foods. Exercising more. Drinking less alcohol. Your doctor may prescribe medicine if lifestyle changes do not help enough and if: Your top number is above 130. Your bottom number is above 80. Your personal target blood pressure may vary. Follow these instructions at home: Eating and drinking  If told, follow the DASH eating plan. To follow this plan: Fill one half of your plate at each meal with fruits and vegetables. Fill one fourth of your plate  at each meal with whole grains. Whole grains include whole-wheat pasta, brown rice, and whole-grain bread. Eat or drink low-fat dairy products, such as skim milk or low-fat yogurt. Fill one fourth of your plate at each meal with low-fat (lean) proteins. Low-fat proteins include fish, chicken without skin, eggs, beans, and tofu. Avoid fatty meat, cured and processed meat, or chicken with skin. Avoid pre-made or processed food. Limit the amount of salt in your diet to less than 1,500 mg each day. Do not drink alcohol if: Your doctor tells you not to drink. You are pregnant, may be pregnant, or are planning to become pregnant. If you drink alcohol: Limit how much you have to: 0-1 drink a day for women. 0-2 drinks a day for men. Know how much alcohol is in your drink. In the [place] one drink equals one 12 oz bottle of beer (355 mL), one 5 oz glass of wine (148 mL), or one 1 oz glass of hard liquor (44 mL). Lifestyle  Work with your doctor to stay at a healthy weight or to lose weight. Ask your doctor what the best weight is for you. Get at least 30 minutes of exercise that causes your heart to beat faster (aerobic exercise) most days of the week. This may include walking, swimming, or biking. Get at least 30 minutes of exercise that strengthens your muscles (resistance exercise) at least 3 days a week. This may include lifting weights or doing Pilates. Do not smoke or use any products that contain nicotine or tobacco. If you need help quitting, ask your doctor. Check your blood pressure at home as told by your doctor. Keep all follow-up visits. Medicines Take over-the-counter and prescription medicines  only as told by your doctor. Follow directions carefully. Do not skip doses of blood pressure medicine. The medicine does not work as well if you skip doses. Skipping doses also puts you at risk for problems. Ask your doctor about side effects or reactions to medicines that you should watch  for. Contact a doctor if: You think you are having a reaction to the medicine you are taking. You have headaches that keep coming back. You feel dizzy. You have swelling in your ankles. You have trouble with your vision. Get help right away if: You get a very bad headache. You start to feel mixed up (confused). You feel weak or numb. You feel faint. You have very bad pain in your: Chest. Belly (abdomen). You vomit more than once. You have trouble breathing. These symptoms may be an emergency. Get help right away. Call 911. Do not wait to see if the symptoms will go away. Do not drive yourself to the hospital. Summary Hypertension is another name for high blood pressure. High blood pressure forces your heart to work harder to pump blood. For most people, a normal blood pressure is less than 120/80. Making healthy choices can help lower blood pressure. If your blood pressure does not get lower with healthy choices, you may need to take medicine. This information is not intended to replace advice given to you by your health care provider. Make sure you discuss any questions you have with your health care provider. Document Revised: 04/17/2021 Document Reviewed: 04/17/2021 Elsevier Patient Education  2024 [employer].

## 2023-09-22 LAB — CMP14+EGFR
ALT: 30 IU/L (ref 0–32)
AST: 27 IU/L (ref 0–40)
Albumin: 4.3 g/dL (ref 3.8–4.9)
Alkaline Phosphatase: 97 IU/L (ref 44–121)
BUN/Creatinine Ratio: 20 (ref 9–23)
BUN: 23 mg/dL (ref 6–24)
Bilirubin Total: 0.5 mg/dL (ref 0.0–1.2)
CO2: 23 mmol/L (ref 20–29)
Calcium: 9.4 mg/dL (ref 8.7–10.2)
Chloride: 99 mmol/L (ref 96–106)
Creatinine, Ser: 1.13 mg/dL — ABNORMAL HIGH (ref 0.57–1.00)
Globulin, Total: 3.1 g/dL (ref 1.5–4.5)
Glucose: 78 mg/dL (ref 70–99)
Potassium: 4.6 mmol/L (ref 3.5–5.2)
Sodium: 136 mmol/L (ref 134–144)
Total Protein: 7.4 g/dL (ref 6.0–8.5)
eGFR: 58 mL/min/{1.73_m2} — ABNORMAL LOW (ref >59)

## 2023-09-26 ENCOUNTER — Other Ambulatory Visit: Payer: Self-pay | Admitting: Internal Medicine

## 2023-09-26 DIAGNOSIS — J4520 Mild intermittent asthma, uncomplicated: Secondary | ICD-10-CM

## 2023-10-03 NOTE — Assessment & Plan Note (Signed)
 She is encouraged to strive for BMI less than 30 to decrease cardiac risk. Advised to aim for at least 150 minutes of exercise per week.

## 2023-10-03 NOTE — Assessment & Plan Note (Signed)
 Chronic, Most recent Pulmonary note reviewed, does not support previous diagnosis of asthma. Pt feels better while on Breo. Will continue with this medication for now. We have previously discussed possibly titrating her off of meds once GERD sx are better controlled.

## 2023-10-03 NOTE — Assessment & Plan Note (Signed)
 Chronic, controlled.  She will continue with amlodipine 2.5mg  daily. She is encouraged to follow a low sodium diet. I will check renal function today. She will rto in four to six months for re-evaluation.

## 2024-01-17 ENCOUNTER — Other Ambulatory Visit: Payer: Self-pay | Admitting: Internal Medicine

## 2024-02-01 ENCOUNTER — Other Ambulatory Visit: Payer: Self-pay | Admitting: Internal Medicine

## 2024-02-01 DIAGNOSIS — J4520 Mild intermittent asthma, uncomplicated: Secondary | ICD-10-CM

## 2024-02-15 ENCOUNTER — Other Ambulatory Visit: Payer: Self-pay | Admitting: Obstetrics and Gynecology

## 2024-02-15 DIAGNOSIS — Z1231 Encounter for screening mammogram for malignant neoplasm of breast: Secondary | ICD-10-CM

## 2024-02-19 ENCOUNTER — Other Ambulatory Visit: Payer: Self-pay | Admitting: Internal Medicine

## 2024-03-03 ENCOUNTER — Ambulatory Visit
Admission: RE | Admit: 2024-03-03 | Discharge: 2024-03-03 | Disposition: A | Discharge status: Home / Self Care | Source: Ambulatory Visit | Attending: Obstetrics and Gynecology | Admitting: Obstetrics and Gynecology

## 2024-03-03 DIAGNOSIS — Z1231 Encounter for screening mammogram for malignant neoplasm of breast: Secondary | ICD-10-CM

## 2024-03-16 ENCOUNTER — Ambulatory Visit: Payer: Self-pay | Admitting: Family Medicine

## 2024-03-30 ENCOUNTER — Ambulatory Visit (INDEPENDENT_AMBULATORY_CARE_PROVIDER_SITE_OTHER): Payer: BC Managed Care – PPO | Admitting: Internal Medicine

## 2024-03-30 ENCOUNTER — Encounter: Payer: Self-pay | Admitting: Internal Medicine

## 2024-03-30 VITALS — BP 122/80 | HR 78 | Temp 98.1°F | Ht 64.0 in | Wt 203.6 lb

## 2024-03-30 DIAGNOSIS — E6609 Other obesity due to excess calories: Secondary | ICD-10-CM

## 2024-03-30 DIAGNOSIS — E66811 Obesity, class 1: Secondary | ICD-10-CM | POA: Diagnosis not present

## 2024-03-30 DIAGNOSIS — B351 Tinea unguium: Secondary | ICD-10-CM | POA: Diagnosis not present

## 2024-03-30 DIAGNOSIS — I10 Essential (primary) hypertension: Secondary | ICD-10-CM | POA: Diagnosis not present

## 2024-03-30 DIAGNOSIS — K219 Gastro-esophageal reflux disease without esophagitis: Secondary | ICD-10-CM

## 2024-03-30 DIAGNOSIS — Z23 Encounter for immunization: Secondary | ICD-10-CM

## 2024-03-30 DIAGNOSIS — Z6834 Body mass index (BMI) 34.0-34.9, adult: Secondary | ICD-10-CM

## 2024-03-30 DIAGNOSIS — J4520 Mild intermittent asthma, uncomplicated: Secondary | ICD-10-CM | POA: Diagnosis not present

## 2024-03-30 DIAGNOSIS — Z0000 Encounter for general adult medical examination without abnormal findings: Secondary | ICD-10-CM

## 2024-03-30 LAB — POCT URINALYSIS DIP (CLINITEK)
Bilirubin, UA: NEGATIVE
Blood, UA: NEGATIVE
Glucose, UA: NEGATIVE mg/dL
Ketones, POC UA: NEGATIVE mg/dL
Leukocytes, UA: NEGATIVE
Nitrite, UA: NEGATIVE
POC PROTEIN,UA: NEGATIVE
Spec Grav, UA: <=1.005 — AB (ref 1.010–1.025)
Urobilinogen, UA: 0.2 U/dL
pH, UA: 6 (ref 5.0–8.0)

## 2024-03-30 MED ORDER — IBUPROFEN 600 MG PO TABS: 600.0000 mg | ORAL_TABLET | Freq: Four times a day (QID) | ORAL | 0 refills | Status: AC | PRN

## 2024-03-30 NOTE — Progress Notes (Signed)
 I,Victoria T Emmitt, CMA,acting as a Neurosurgeon for Catheryn LOISE Slocumb, MD.,have documented all relevant documentation on the behalf of Catheryn LOISE Slocumb, MD,as directed by  Catheryn LOISE Slocumb, MD while in the presence of Catheryn LOISE Slocumb, MD.  Subjective:    Patient ID: Lauren Carroll , female    DOB: 08/20/1970 , 53 y.o.   MRN: [id number]  Chief Complaint  Patient presents with   Annual Exam    Patient presents today for annual exam. She reports compliance with medications. Denies headache, chest pain & sob. GYN: Dr Rutherford.    Hypertension    HPI Discussed the use of AI scribe software for clinical note transcription with the patient, who gave verbal consent to proceed.  History of Present Illness Lauren Carroll is a 53 year old female who presents for an annual physical exam.  She is concerned about a possible fungal infection under her nails and is seeking a referral to a dermatologist. She is worried about the wait time for an appointment.  She is preparing for a half marathon in [place] on October 17th and is considering using compression socks for running. She is concerned about feeling hot while wearing them. She plans to take ibuprofen  600 mg during her long runs. She uses Gatorade for electrolyte replacement during long runs and drinks four bottles of water on a good day and two on a typical day.  She mentions a past issue with blood in her stools, which has resolved. She had a CT scan of her abdomen earlier this year due to abdominal pain, which has since resolved.  She is currently on a low dose of amlodipine  and has been off inhalers, reporting no recent shortness of breath. She uses albuterol  as needed and plans to take it with her to [place] . No shortness of breath during daily activities or running. She sleeps on one pillow and reports no issues with lying flat.   Hypertension This is a chronic problem. The current episode started more than 1 year ago. The problem is unchanged.  The problem is controlled. Pertinent negatives include no blurred vision, chest pain, neck pain or shortness of breath. Past treatments include calcium channel blockers. The current treatment provides moderate improvement. There are no compliance problems.      Past Medical History:  Diagnosis Date   Allergic rhinitis    Asthma    Asthmatic bronchitis    Elevated blood pressure reading 09/01/2020   History of URI (upper respiratory infection)    Overactive bladder    Palpitations 09/01/2020   Syncope and collapse 09/01/2020     Family History  Problem Relation Age of Onset   Hypertension Mother    Diabetes Father    Hypertension Father    Coronary artery disease Father    Heart attack Father    Hypertension Sister    Hypertension Brother    Transient ischemic attack Maternal Grandmother      Current Outpatient Medications:    albuterol  (PROVENTIL ) (2.5 MG/3ML) 0.083% nebulizer solution, Take 3 mLs (2.5 mg total) by nebulization every 4 (four) hours as needed for wheezing or shortness of breath., Disp: 75 mL, Rfl: 11   albuterol  (VENTOLIN  HFA) 108 (90 Base) MCG/ACT inhaler, INHALE 2 PUFFS BY MOUTH EVERY 6 HOURS AS NEEDED FOR WHEEZING FOR SHORTNESS OF BREATH, Disp: 54 g, Rfl: 2   amLODipine  (NORVASC ) 2.5 MG tablet, Take 1 tablet (2.5 mg total) by mouth daily., Disp: 90 tablet, Rfl: 2   cetirizine (  ZYRTEC) 10 MG tablet, Take 10 mg by mouth daily., Disp: , Rfl:    ciclopirox (PENLAC) 8 % solution, Apply topically at bedtime. Apply over nail and surrounding skin. Apply daily over previous coat. After seven (7) days, may remove with alcohol and continue cycle., Disp: 6.6 mL, Rfl: 0   ibuprofen  (ADVIL ) 600 MG tablet, Take 1 tablet (600 mg total) by mouth every 6 (six) hours as needed., Disp: 30 tablet, Rfl: 0   Lactobacillus (PROBIOTIC ACIDOPHILUS PO), Take 1 capsule by mouth daily., Disp: , Rfl:    Multiple Vitamin (MULTIVITAMINS PO), Take 1 tablet by mouth once a day, Disp: , Rfl:     omeprazole  (PRILOSEC) 40 MG capsule, Take 1 capsule (40 mg total) by mouth daily., Disp: 90 capsule, Rfl: 1   Vitamin D , Cholecalciferol, 50 MCG (2000 UT) CAPS, Take by mouth daily., Disp: , Rfl:    BREO ELLIPTA  100-25 MCG/ACT AEPB, Inhale 1 puff by mouth once daily (Patient not taking: Reported on 03/30/2024), Disp: 60 each, Rfl: 0   montelukast  (SINGULAIR ) 10 MG tablet, Take 1 tablet by mouth once daily (Patient not taking: Reported on 03/30/2024), Disp: 90 tablet, Rfl: 0   Allergies  Allergen Reactions   Cephalosporins    Penicillins    Sulfa Drugs Cross Reactors     itching      The patient states she uses none for birth control. No LMP recorded (lmp unknown). (Menstrual status: Perimenopausal).. Negative for Dysmenorrhea. Negative for: breast discharge, breast lump(s), breast pain and breast self exam. Associated symptoms include abnormal vaginal bleeding. Pertinent negatives include abnormal bleeding (hematology), anxiety, decreased libido, depression, difficulty falling sleep, dyspareunia, history of infertility, nocturia, sexual dysfunction, sleep disturbances, urinary incontinence, urinary urgency, vaginal discharge and vaginal itching. Diet regular.The patient states her exercise level is  moderate.  . The patient's tobacco use is:  Social History   Tobacco Use  Smoking Status Never  Smokeless Tobacco Never  . She has been exposed to passive smoke. The patient's alcohol use is:  Social History   Substance and Sexual Activity  Alcohol Use No   Review of Systems  Constitutional: Negative.   HENT: Negative.    Eyes: Negative.  Negative for blurred vision.  Respiratory: Negative.  Negative for shortness of breath.   Cardiovascular: Negative.  Negative for chest pain.  Gastrointestinal: Negative.   Endocrine: Negative.   Genitourinary: Negative.   Musculoskeletal: Negative.  Negative for neck pain.  Skin:  Positive for rash.  Allergic/Immunologic: Negative.   Neurological:  Negative.   Hematological: Negative.   Psychiatric/Behavioral: Negative.       Today's Vitals   03/30/24 0834  BP: 122/80  Pulse: 78  Temp: 98.1 F (36.7 C)  SpO2: 98%  Weight: 203 lb 9.6 oz (92.4 kg)  Height: 5' 4 (1.626 m)   Body mass index is 34.95 kg/m.  Wt Readings from Last 3 Encounters:  03/30/24 203 lb 9.6 oz (92.4 kg)  09/21/23 184 lb (83.5 kg)  03/23/23 188 lb 9.6 oz (85.5 kg)     Objective:  Physical Exam Vitals and nursing note reviewed.  Constitutional:      Appearance: Normal appearance.  HENT:     Head: Normocephalic and atraumatic.     Right Ear: Tympanic membrane, ear canal and external ear normal.     Left Ear: Tympanic membrane, ear canal and external ear normal.     Nose: Nose normal.     Mouth/Throat:     Mouth: Mucous membranes  are moist.     Pharynx: Oropharynx is clear.  Eyes:     Extraocular Movements: Extraocular movements intact.     Conjunctiva/sclera: Conjunctivae normal.     Pupils: Pupils are equal, round, and reactive to light.  Cardiovascular:     Rate and Rhythm: Normal rate and regular rhythm.     Pulses: Normal pulses.     Heart sounds: Normal heart sounds.  Pulmonary:     Effort: Pulmonary effort is normal.     Breath sounds: Normal breath sounds.  Chest:  Breasts:    Tanner Score is 5.     Right: Normal.     Left: Normal.  Abdominal:     General: Abdomen is flat. Bowel sounds are normal.     Palpations: Abdomen is soft.  Genitourinary:    Comments: deferred Musculoskeletal:        General: Normal range of motion.     Cervical back: Normal range of motion and neck supple.  Skin:    General: Skin is warm and dry.     Comments: Thickened skin, scaly underneath nails  Neurological:     General: No focal deficit present.     Mental Status: She is alert and oriented to person, place, and time.  Psychiatric:        Mood and Affect: Mood normal.        Behavior: Behavior normal.           Assessment And Plan:      Annual physical exam Assessment & Plan: A full exam was performed.  Importance of monthly self breast exams was discussed with the patient.  She is advised to get 30-45 minutes of regular exercise, no less than four to five days per week. Both weight-bearing and aerobic exercises are recommended.  She is advised to follow a healthy diet with at least six fruits/veggies per day, decrease intake of red meat and other saturated fats and to increase fish intake to twice weekly.  Meats/fish should not be fried -- baked, boiled or broiled is preferable. It is also important to cut back on your sugar intake.  Be sure to read labels - try to avoid anything with added sugar, high fructose corn syrup or other sweeteners.  If you must use a sweetener, you can try stevia or monkfruit.  It is also important to avoid artificially sweetened foods/beverages and diet drinks. Lastly, wear SPF 50 sunscreen on exposed skin and when in direct sunlight for an extended period of time.  Be sure to avoid fast food restaurants and aim for at least 60 ounces of water daily.      Orders: -     CBC -     CMP14+EGFR -     Lipid panel -     Hemoglobin A1c  Essential hypertension, benign Assessment & Plan: Chronic, controlled.  EKG performed, NSR w/ right axis.  She will continue with amlodipine  2.5mg  daily. She is encouraged to follow a low sodium diet. I will check renal function today. She will rto in four to six months for re-evaluation.   Orders: -     POCT URINALYSIS DIP (CLINITEK) -     Microalbumin / creatinine urine ratio -     EKG 12-Lead  Mild intermittent asthma without complication Assessment & Plan: Chronic, Pulmonary has taken her off of steroid inhalers. She will use albuterol  prn.  - Ensure albuterol  is available for travel   Onychomycosis Assessment & Plan: I will try  topical treatment first.  She would likely benefit from avoiding use of gel nails at this time.  - Send Penlac to use topically  daily, remove with alcohol pad once weekly   Class 1 obesity due to excess calories with serious comorbidity and body mass index (BMI) of 34.0 to 34.9 in adult Assessment & Plan: She is encouraged to strive for BMI less than 30 to decrease cardiac risk. Advised to aim for at least 150 minutes of exercise per week.    Immunization due -     Flu vaccine trivalent PF, 6mos and older(Flulaval,Afluria,Fluarix,Fluzone)  Other orders -     Ibuprofen ; Take 1 tablet (600 mg total) by mouth every 6 (six) hours as needed.  Dispense: 30 tablet; Refill: 0 -     Ciclopirox; Apply topically at bedtime. Apply over nail and surrounding skin. Apply daily over previous coat. After seven (7) days, may remove with alcohol and continue cycle.  Dispense: 6.6 mL; Refill: 0    Return for 1 year physical, 6 month bp. Patient was given opportunity to ask questions. Patient verbalized understanding of the plan and was able to repeat key elements of the plan. All questions were answered to their satisfaction.    I, Catheryn LOISE Slocumb, MD, have reviewed all documentation for this visit. The documentation on 03/30/24 for the exam, diagnosis, procedures, and orders are all accurate and complete.

## 2024-03-30 NOTE — Patient Instructions (Signed)

## 2024-03-31 LAB — LIPID PANEL
Chol/HDL Ratio: 2.8 ratio (ref 0.0–4.4)
Cholesterol, Total: 203 mg/dL — ABNORMAL HIGH (ref 100–199)
HDL: 73 mg/dL (ref >39)
LDL Chol Calc (NIH): 117 mg/dL — ABNORMAL HIGH (ref 0–99)
Triglycerides: 73 mg/dL (ref 0–149)
VLDL Cholesterol Cal: 13 mg/dL (ref 5–40)

## 2024-03-31 LAB — CMP14+EGFR
ALT: 36 IU/L — ABNORMAL HIGH (ref 0–32)
AST: 31 IU/L (ref 0–40)
Albumin: 4.3 g/dL (ref 3.8–4.9)
Alkaline Phosphatase: 84 IU/L (ref 49–135)
BUN/Creatinine Ratio: 23 (ref 9–23)
BUN: 20 mg/dL (ref 6–24)
Bilirubin Total: 0.8 mg/dL (ref 0.0–1.2)
CO2: 22 mmol/L (ref 20–29)
Calcium: 9.6 mg/dL (ref 8.7–10.2)
Chloride: 101 mmol/L (ref 96–106)
Creatinine, Ser: 0.86 mg/dL (ref 0.57–1.00)
Globulin, Total: 2.9 g/dL (ref 1.5–4.5)
Glucose: 87 mg/dL (ref 70–99)
Potassium: 4.5 mmol/L (ref 3.5–5.2)
Sodium: 139 mmol/L (ref 134–144)
Total Protein: 7.2 g/dL (ref 6.0–8.5)
eGFR: 80 mL/min/1.73 (ref >59)

## 2024-03-31 LAB — CBC
Hematocrit: 41.6 % (ref 34.0–46.6)
Hemoglobin: 13.5 g/dL (ref 11.1–15.9)
MCH: 31 pg (ref 26.6–33.0)
MCHC: 32.5 g/dL (ref 31.5–35.7)
MCV: 95 fL (ref 79–97)
Platelets: 297 x10E3/uL (ref 150–450)
RBC: 4.36 x10E6/uL (ref 3.77–5.28)
RDW: 12.3 % (ref 11.7–15.4)
WBC: 5.7 x10E3/uL (ref 3.4–10.8)

## 2024-03-31 LAB — HEMOGLOBIN A1C
Est. average glucose Bld gHb Est-mCnc: 137 mg/dL
Hgb A1c MFr Bld: 6.4 % — ABNORMAL HIGH (ref 4.8–5.6)

## 2024-03-31 LAB — MICROALBUMIN / CREATININE URINE RATIO
Creatinine, Urine: 32.8 mg/dL
Microalb/Creat Ratio: <9 mg/g{creat} (ref 0–29)
Microalbumin, Urine: <3 ug/mL

## 2024-04-03 ENCOUNTER — Ambulatory Visit: Payer: Self-pay | Admitting: Internal Medicine

## 2024-04-09 ENCOUNTER — Encounter: Payer: Self-pay | Admitting: Internal Medicine

## 2024-04-09 DIAGNOSIS — B351 Tinea unguium: Secondary | ICD-10-CM | POA: Insufficient documentation

## 2024-04-09 MED ORDER — CICLOPIROX 8 % EX SOLN: Freq: Every day | CUTANEOUS | 0 refills | Status: AC

## 2024-04-09 NOTE — Assessment & Plan Note (Signed)
 She is encouraged to strive for BMI less than 30 to decrease cardiac risk. Advised to aim for at least 150 minutes of exercise per week.

## 2024-04-09 NOTE — Assessment & Plan Note (Signed)

## 2024-04-09 NOTE — Assessment & Plan Note (Signed)
 I will try topical treatment first.  She would likely benefit from avoiding use of gel nails at this time.  - Send Penlac to use topically daily, remove with alcohol pad once weekly

## 2024-04-09 NOTE — Assessment & Plan Note (Signed)
 Chronic, Pulmonary has taken her off of steroid inhalers. She will use albuterol  prn.  - Ensure albuterol  is available for travel

## 2024-04-09 NOTE — Assessment & Plan Note (Addendum)
 Chronic, controlled.  EKG performed, NSR w/ right axis.  She will continue with amlodipine  2.5mg  daily. She is encouraged to follow a low sodium diet. I will check renal function today. She will rto in four to six months for re-evaluation.

## 2024-04-13 ENCOUNTER — Telehealth: Payer: Self-pay

## 2024-04-13 NOTE — Telephone Encounter (Signed)
 No coverage was found for [place].  No eligibility was found. Please reconfirm the member's health plan coverage before re-submitting. Tried to process on 10/2.

## 2024-09-27 ENCOUNTER — Ambulatory Visit: Payer: Self-pay | Admitting: Internal Medicine

## 2025-04-05 ENCOUNTER — Encounter: Payer: Self-pay | Admitting: Internal Medicine
